# Patient Record
Sex: Female | Born: 1980 | Race: White | Hispanic: No | Marital: Married | Smoking: Former smoker
Health system: Southern US, Community
[De-identification: ages and names within clinical notes are randomized; demographics above are authoritative.]

## PROBLEM LIST (undated history)

## (undated) ENCOUNTER — Inpatient Hospital Stay (HOSPITAL_COMMUNITY): Payer: Self-pay

## (undated) DIAGNOSIS — Z3490 Encounter for supervision of normal pregnancy, unspecified, unspecified trimester: Secondary | ICD-10-CM

## (undated) DIAGNOSIS — E669 Obesity, unspecified: Secondary | ICD-10-CM

## (undated) DIAGNOSIS — O039 Complete or unspecified spontaneous abortion without complication: Secondary | ICD-10-CM

## (undated) DIAGNOSIS — IMO0001 Reserved for inherently not codable concepts without codable children: Secondary | ICD-10-CM

## (undated) DIAGNOSIS — O24419 Gestational diabetes mellitus in pregnancy, unspecified control: Secondary | ICD-10-CM

## (undated) DIAGNOSIS — E282 Polycystic ovarian syndrome: Secondary | ICD-10-CM

## (undated) HISTORY — DX: Obesity, unspecified: E66.9

## (undated) HISTORY — DX: Complete or unspecified spontaneous abortion without complication: O03.9

## (undated) HISTORY — DX: Encounter for supervision of normal pregnancy, unspecified, unspecified trimester: Z34.90

## (undated) HISTORY — DX: Gestational diabetes mellitus in pregnancy, unspecified control: O24.419

## (undated) HISTORY — DX: Polycystic ovarian syndrome: E28.2

## (undated) HISTORY — PX: EYE SURGERY: SHX253

---

## 2005-07-05 ENCOUNTER — Emergency Department (HOSPITAL_COMMUNITY): Admission: EM | Admit: 2005-07-05 | Discharge: 2005-07-05 | Payer: Self-pay | Admitting: Emergency Medicine

## 2005-09-28 ENCOUNTER — Emergency Department (HOSPITAL_COMMUNITY): Admission: EM | Admit: 2005-09-28 | Discharge: 2005-09-28 | Payer: Self-pay | Admitting: Emergency Medicine

## 2006-02-04 ENCOUNTER — Emergency Department (HOSPITAL_COMMUNITY): Admission: EM | Admit: 2006-02-04 | Discharge: 2006-02-04 | Payer: Self-pay | Admitting: Emergency Medicine

## 2006-03-13 ENCOUNTER — Emergency Department (HOSPITAL_COMMUNITY): Admission: EM | Admit: 2006-03-13 | Discharge: 2006-03-13 | Payer: Self-pay | Admitting: Emergency Medicine

## 2006-05-17 ENCOUNTER — Emergency Department (HOSPITAL_COMMUNITY): Admission: EM | Admit: 2006-05-17 | Discharge: 2006-05-17 | Payer: Self-pay | Admitting: Emergency Medicine

## 2006-08-31 ENCOUNTER — Emergency Department (HOSPITAL_COMMUNITY): Admission: EM | Admit: 2006-08-31 | Discharge: 2006-08-31 | Payer: Self-pay | Admitting: Emergency Medicine

## 2006-10-20 ENCOUNTER — Emergency Department (HOSPITAL_COMMUNITY): Admission: EM | Admit: 2006-10-20 | Discharge: 2006-10-20 | Payer: Self-pay | Admitting: Emergency Medicine

## 2007-07-02 ENCOUNTER — Emergency Department (HOSPITAL_COMMUNITY): Admission: EM | Admit: 2007-07-02 | Discharge: 2007-07-02 | Payer: Self-pay | Admitting: Emergency Medicine

## 2008-03-09 ENCOUNTER — Emergency Department (HOSPITAL_COMMUNITY): Admission: EM | Admit: 2008-03-09 | Discharge: 2008-03-10 | Payer: Self-pay | Admitting: Emergency Medicine

## 2008-04-14 ENCOUNTER — Emergency Department (HOSPITAL_COMMUNITY): Admission: EM | Admit: 2008-04-14 | Discharge: 2008-04-14 | Payer: Self-pay | Admitting: Emergency Medicine

## 2008-08-25 ENCOUNTER — Emergency Department (HOSPITAL_COMMUNITY): Admission: EM | Admit: 2008-08-25 | Discharge: 2008-08-26 | Payer: Self-pay | Admitting: Emergency Medicine

## 2008-10-20 ENCOUNTER — Emergency Department (HOSPITAL_COMMUNITY): Admission: EM | Admit: 2008-10-20 | Discharge: 2008-10-20 | Payer: Self-pay | Admitting: Emergency Medicine

## 2009-02-11 ENCOUNTER — Emergency Department (HOSPITAL_COMMUNITY): Admission: EM | Admit: 2009-02-11 | Discharge: 2009-02-11 | Payer: Self-pay | Admitting: Emergency Medicine

## 2009-03-23 ENCOUNTER — Emergency Department (HOSPITAL_COMMUNITY): Admission: EM | Admit: 2009-03-23 | Discharge: 2009-03-23 | Payer: Self-pay | Admitting: Emergency Medicine

## 2009-06-15 ENCOUNTER — Emergency Department (HOSPITAL_COMMUNITY): Admission: EM | Admit: 2009-06-15 | Discharge: 2009-06-15 | Payer: Self-pay | Admitting: Emergency Medicine

## 2009-06-24 IMAGING — CR DG SHOULDER 2+V*R*
3 series · 3 of 3 positions shown · non-contrast
Comparison: None

CLINICAL DATA: Right shoulder pain

RIGHT SHOULDER - 2+ VIEW

[view not recorded (1 of 3)]
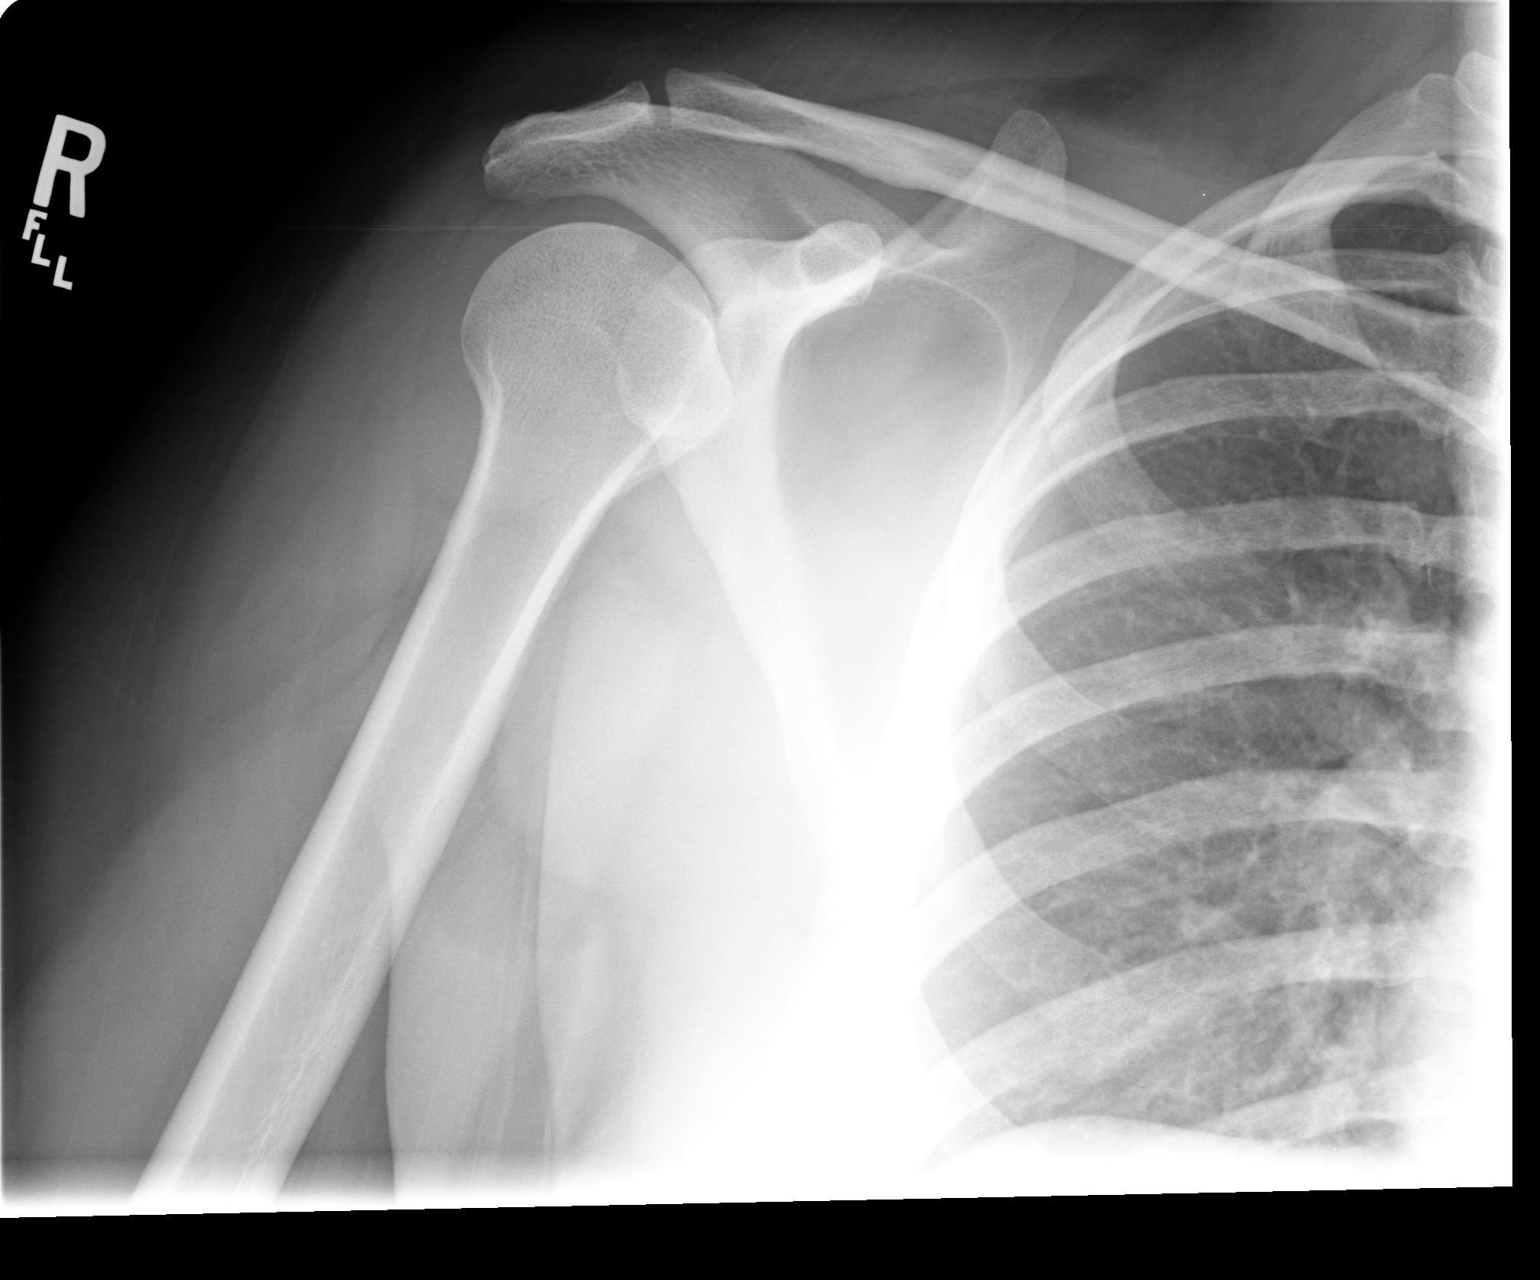

[view not recorded (2 of 3)]
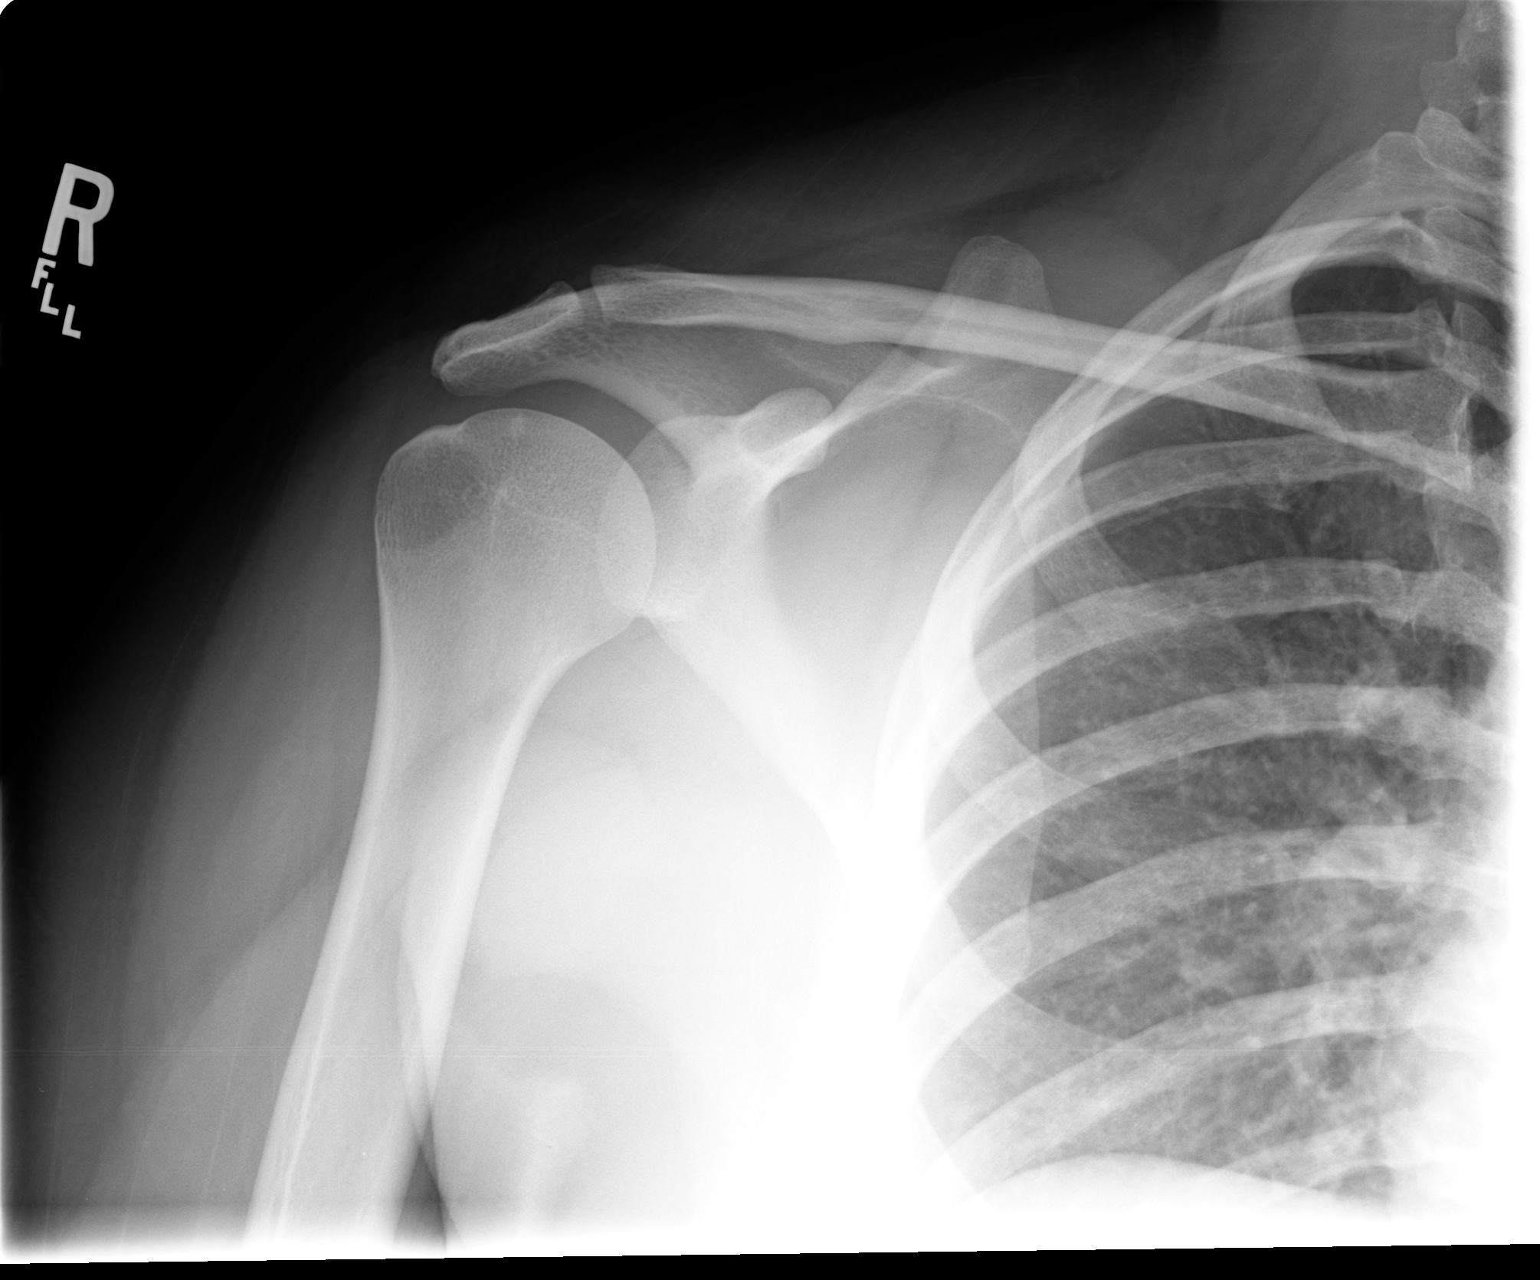

[view not recorded (3 of 3)]
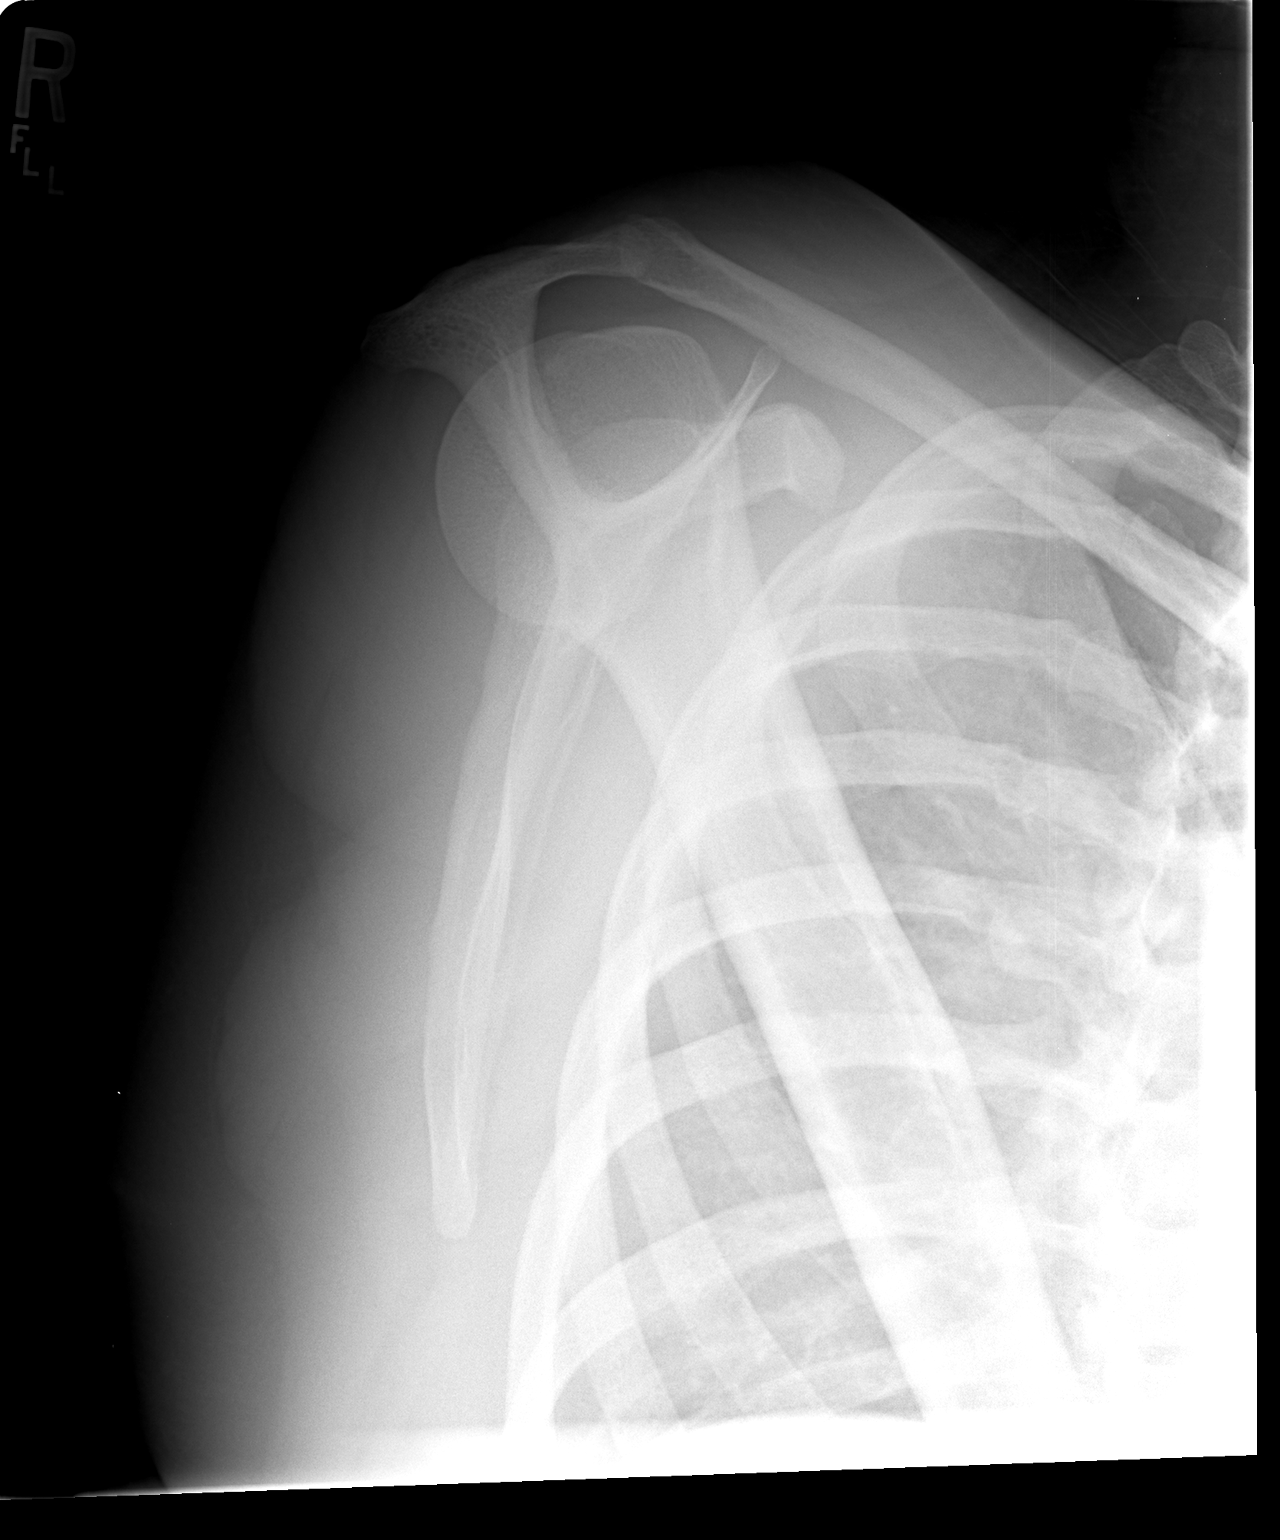

[3 of 3 positions shown; findings below may reference images not displayed]

FINDINGS: AC joint alignment normal.
Bone mineralization normal.
No acute fracture, dislocation, or bone destruction.
Visualized right ribs unremarkable.
IMPRESSION: No acute abnormalities.

## 2009-11-04 ENCOUNTER — Emergency Department (HOSPITAL_COMMUNITY): Admission: EM | Admit: 2009-11-04 | Discharge: 2009-11-04 | Payer: Self-pay | Admitting: Emergency Medicine

## 2011-03-17 LAB — PREGNANCY, URINE: Preg Test, Ur: NEGATIVE

## 2011-09-06 LAB — PREGNANCY, URINE: Preg Test, Ur: NEGATIVE

## 2013-05-03 ENCOUNTER — Ambulatory Visit (INDEPENDENT_AMBULATORY_CARE_PROVIDER_SITE_OTHER): Payer: BC Managed Care – PPO | Admitting: Obstetrics & Gynecology

## 2013-05-03 ENCOUNTER — Encounter: Payer: Self-pay | Admitting: Obstetrics & Gynecology

## 2013-05-03 VITALS — BP 140/90 | Ht 68.0 in | Wt 366.0 lb

## 2013-05-03 DIAGNOSIS — N912 Amenorrhea, unspecified: Secondary | ICD-10-CM

## 2013-05-03 LAB — TSH: TSH: 3.094 u[IU]/mL (ref 0.350–4.500)

## 2013-05-03 MED ORDER — MEDROXYPROGESTERONE ACETATE 10 MG PO TABS: 10.0000 mg | ORAL_TABLET | Freq: Every day | ORAL | Status: DC

## 2013-05-03 MED ORDER — NORGESTIM-ETH ESTRAD TRIPHASIC 0.18/0.215/0.25 MG-25 MCG PO TABS: 1.0000 | ORAL_TABLET | Freq: Every day | ORAL | Status: DC

## 2013-05-03 NOTE — Progress Notes (Signed)
Patient ID: Megan Hardin, female   DOB: [birth year], 32 y.o.   MRN: [id number] Lifelong anovulatory At most 4 periods a year Trying for 8 years No fathered No dyspareunia Sex 3-4 times a week Not diabetic  Check hgb a1c, tsh Provera sprintec Follow up 3 months Start ovulation induction

## 2013-05-06 ENCOUNTER — Telehealth: Payer: Self-pay | Admitting: Obstetrics & Gynecology

## 2013-05-06 MED ORDER — NORGESTREL-ETHINYL ESTRADIOL 0.3-30 MG-MCG PO TABS: 1.0000 | ORAL_TABLET | Freq: Every day | ORAL | Status: DC

## 2013-05-06 NOTE — Telephone Encounter (Signed)
See routing

## 2013-05-06 NOTE — Telephone Encounter (Signed)
Pt aware that different birth control pill had been e prescribed. JSY

## 2013-05-06 NOTE — Addendum Note (Signed)
Addended by: Lazaro Arms on: 05/06/2013 11:54 AM   Modules accepted: Orders

## 2013-05-06 NOTE — Telephone Encounter (Signed)
Spoke with pt. The birth control is $130.00. What else do you advise?

## 2013-05-06 NOTE — Telephone Encounter (Signed)
Different OCP e prescribed

## 2013-08-06 ENCOUNTER — Encounter (HOSPITAL_COMMUNITY): Payer: Self-pay | Admitting: *Deleted

## 2013-08-06 ENCOUNTER — Emergency Department (HOSPITAL_COMMUNITY)
Admission: EM | Admit: 2013-08-06 | Discharge: 2013-08-06 | Disposition: A | Discharge status: Home / Self Care | Payer: Self-pay | Attending: Emergency Medicine | Admitting: Emergency Medicine

## 2013-08-06 DIAGNOSIS — Z8742 Personal history of other diseases of the female genital tract: Secondary | ICD-10-CM | POA: Insufficient documentation

## 2013-08-06 DIAGNOSIS — L089 Local infection of the skin and subcutaneous tissue, unspecified: Secondary | ICD-10-CM | POA: Insufficient documentation

## 2013-08-06 DIAGNOSIS — Z87891 Personal history of nicotine dependence: Secondary | ICD-10-CM | POA: Insufficient documentation

## 2013-08-06 DIAGNOSIS — L0390 Cellulitis, unspecified: Secondary | ICD-10-CM

## 2013-08-06 DIAGNOSIS — L02419 Cutaneous abscess of limb, unspecified: Secondary | ICD-10-CM | POA: Insufficient documentation

## 2013-08-06 DIAGNOSIS — Y929 Unspecified place or not applicable: Secondary | ICD-10-CM | POA: Insufficient documentation

## 2013-08-06 DIAGNOSIS — Y939 Activity, unspecified: Secondary | ICD-10-CM | POA: Insufficient documentation

## 2013-08-06 MED ORDER — SULFAMETHOXAZOLE-TMP DS 800-160 MG PO TABS
1.0000 | ORAL_TABLET | Freq: Once | ORAL | Status: AC
  Administered 2013-08-06: 1 via ORAL
  Filled 2013-08-06: qty 1

## 2013-08-06 MED ORDER — HYDROCODONE-ACETAMINOPHEN 5-325 MG PO TABS: 1.0000 | ORAL_TABLET | ORAL | Status: DC | PRN

## 2013-08-06 MED ORDER — CEPHALEXIN 500 MG PO CAPS: 500.0000 mg | ORAL_CAPSULE | Freq: Four times a day (QID) | ORAL | Status: DC

## 2013-08-06 MED ORDER — HYDROCODONE-ACETAMINOPHEN 5-325 MG PO TABS
1.0000 | ORAL_TABLET | Freq: Once | ORAL | Status: AC
  Administered 2013-08-06: 1 via ORAL
  Filled 2013-08-06: qty 1

## 2013-08-06 MED ORDER — CEPHALEXIN 500 MG PO CAPS
500.0000 mg | ORAL_CAPSULE | Freq: Once | ORAL | Status: AC
  Administered 2013-08-06: 500 mg via ORAL
  Filled 2013-08-06: qty 1

## 2013-08-06 MED ORDER — SULFAMETHOXAZOLE-TRIMETHOPRIM 800-160 MG PO TABS: 1.0000 | ORAL_TABLET | Freq: Two times a day (BID) | ORAL | Status: DC

## 2013-08-06 NOTE — ED Notes (Signed)
Pt noticed a pimple on her lower leg yesterday. Pt states today it has gotten red, area has spread, and it is very painful.

## 2013-08-07 ENCOUNTER — Encounter (HOSPITAL_COMMUNITY): Payer: Self-pay

## 2013-08-07 ENCOUNTER — Emergency Department (HOSPITAL_COMMUNITY)
Admission: EM | Admit: 2013-08-07 | Discharge: 2013-08-07 | Disposition: A | Discharge status: Home / Self Care | Payer: Self-pay | Attending: Emergency Medicine | Admitting: Emergency Medicine

## 2013-08-07 DIAGNOSIS — Z87891 Personal history of nicotine dependence: Secondary | ICD-10-CM | POA: Insufficient documentation

## 2013-08-07 DIAGNOSIS — F411 Generalized anxiety disorder: Secondary | ICD-10-CM | POA: Insufficient documentation

## 2013-08-07 DIAGNOSIS — R062 Wheezing: Secondary | ICD-10-CM | POA: Insufficient documentation

## 2013-08-07 DIAGNOSIS — L02419 Cutaneous abscess of limb, unspecified: Secondary | ICD-10-CM | POA: Insufficient documentation

## 2013-08-07 DIAGNOSIS — R000 Tachycardia, unspecified: Secondary | ICD-10-CM | POA: Insufficient documentation

## 2013-08-07 DIAGNOSIS — Z792 Long term (current) use of antibiotics: Secondary | ICD-10-CM | POA: Insufficient documentation

## 2013-08-07 DIAGNOSIS — Z862 Personal history of diseases of the blood and blood-forming organs and certain disorders involving the immune mechanism: Secondary | ICD-10-CM | POA: Insufficient documentation

## 2013-08-07 DIAGNOSIS — Z79899 Other long term (current) drug therapy: Secondary | ICD-10-CM | POA: Insufficient documentation

## 2013-08-07 DIAGNOSIS — Z8639 Personal history of other endocrine, nutritional and metabolic disease: Secondary | ICD-10-CM | POA: Insufficient documentation

## 2013-08-07 LAB — GLUCOSE, CAPILLARY: Glucose-Capillary: 113 mg/dL — ABNORMAL HIGH (ref 70–99)

## 2013-08-07 LAB — CBC WITH DIFFERENTIAL/PLATELET
Eosinophils Relative: 2 % (ref 0–5)
HCT: 38.8 % (ref 36.0–46.0)
Hemoglobin: 13.2 g/dL (ref 12.0–15.0)
Lymphocytes Relative: 12 % (ref 12–46)
MCV: 90.9 fL (ref 78.0–100.0)
Monocytes Absolute: 0.8 10*3/uL (ref 0.1–1.0)
Monocytes Relative: 6 % (ref 3–12)
Neutro Abs: 11 10*3/uL — ABNORMAL HIGH (ref 1.7–7.7)
RDW: 13.3 % (ref 11.5–15.5)
WBC: 13.7 10*3/uL — ABNORMAL HIGH (ref 4.0–10.5)

## 2013-08-07 LAB — BASIC METABOLIC PANEL
BUN: 8 mg/dL (ref 6–23)
CO2: 23 mEq/L (ref 19–32)
Calcium: 9.5 mg/dL (ref 8.4–10.5)
Chloride: 100 mEq/L (ref 96–112)
Creatinine, Ser: 0.78 mg/dL (ref 0.50–1.10)
Glucose, Bld: 113 mg/dL — ABNORMAL HIGH (ref 70–99)

## 2013-08-07 MED ORDER — MORPHINE SULFATE 4 MG/ML IJ SOLN
4.0000 mg | Freq: Once | INTRAMUSCULAR | Status: AC
  Administered 2013-08-07: 4 mg via INTRAVENOUS
  Filled 2013-08-07: qty 1

## 2013-08-07 MED ORDER — VANCOMYCIN HCL IN DEXTROSE 1-5 GM/200ML-% IV SOLN
1000.0000 mg | Freq: Once | INTRAVENOUS | Status: AC
  Administered 2013-08-07: 1000 mg via INTRAVENOUS
  Filled 2013-08-07: qty 200

## 2013-08-07 MED ORDER — ONDANSETRON HCL 4 MG PO TABS
4.0000 mg | ORAL_TABLET | Freq: Once | ORAL | Status: AC
  Administered 2013-08-07: 4 mg via ORAL
  Filled 2013-08-07: qty 1

## 2013-08-07 MED ORDER — KETOROLAC TROMETHAMINE 10 MG PO TABS
10.0000 mg | ORAL_TABLET | Freq: Once | ORAL | Status: AC
  Administered 2013-08-07: 10 mg via ORAL
  Filled 2013-08-07: qty 1

## 2013-08-07 NOTE — ED Notes (Signed)
Pt here for recheck of her left lower leg, has a "pimple", was seen in the ed yesterday for the same, has not gotten scripts filled

## 2013-08-07 NOTE — ED Provider Notes (Signed)
CSN: [id number]     Arrival date & time 08/07/13  0857 History   First MD Initiated Contact with Patient 08/07/13 [phone]     No chief complaint on file.  (Consider location/radiation/quality/duration/timing/severity/associated sxs/prior Treatment) HPI Comments: Pt states she noticed a pimple on the left leg last week. The redness around the pimple area got progressively worse. She came to the ED yesterday and was noted to have some early cellulitis. Rx for keflex, septra, and norco were given to the patient. Pt did not get these filled. Today she noted more pain with walking and attempting to work. She felt "different" in her body and noted that it was difficult to concentrate. She was concerned about infection getting worse and spreading in her body. No fever, chill, LOC, and no n/v. She present now for evaluation of worsening symptoms.  The history is provided by the patient.    Past Medical History  Diagnosis Date  . PCOS (polycystic ovarian syndrome)    Past Surgical History  Procedure Laterality Date  . Eye surgery Left    Family History  Problem Relation Age of Onset  . Diabetes Mother   . Hypertension Father   . Diabetes Maternal Grandmother   . Diabetes Maternal Grandfather    History  Substance Use Topics  . Smoking status: Former Smoker    Types: Cigarettes  . Smokeless tobacco: Not on file  . Alcohol Use: Yes     Comment: socially   OB History   Grav Para Term Preterm Abortions TAB SAB Ect Mult Living                 Review of Systems  Constitutional: Negative for activity change.       All ROS Neg except as noted in HPI  HENT: Negative for nosebleeds and neck pain.   Eyes: Negative for photophobia and discharge.  Respiratory: Positive for wheezing. Negative for cough and shortness of breath.   Cardiovascular: Negative for chest pain and palpitations.  Gastrointestinal: Negative for abdominal pain and blood in stool.  Genitourinary: Negative for dysuria,  frequency and hematuria.  Musculoskeletal: Negative for back pain and arthralgias.       Leg pain  Skin: Negative.   Neurological: Negative for dizziness, seizures and speech difficulty.  Psychiatric/Behavioral: Negative for hallucinations and confusion. The patient is nervous/anxious.     Allergies  Dilaudid  Home Medications   Current Outpatient Rx  Name  Route  Sig  Dispense  Refill  . cephALEXin (KEFLEX) 500 MG capsule   Oral   Take 1 capsule (500 mg total) by mouth 4 (four) times daily.   40 capsule   0   . Doxylamine Succinate, Sleep, (SLEEP AID PO)   Oral   Take 1 tablet by mouth at bedtime.          Marland Kitchen HYDROcodone-acetaminophen (NORCO/VICODIN) 5-325 MG per tablet   Oral   Take 1 tablet by mouth every 4 (four) hours as needed for pain.   20 tablet   0   . norgestrel-ethinyl estradiol (LO/OVRAL,CRYSELLE) 0.3-30 MG-MCG tablet   Oral   Take 1 tablet by mouth daily.   1 Package   11   . sulfamethoxazole-trimethoprim (SEPTRA DS) 800-160 MG per tablet   Oral   Take 1 tablet by mouth every 12 (twelve) hours.   20 tablet   0    LMP 07/06/2013 Physical Exam  Nursing note and vitals reviewed. Constitutional: She is oriented to person, place, and time.  She appears well-developed and well-nourished.  Non-toxic appearance.  HENT:  Head: Normocephalic.  Right Ear: Tympanic membrane and external ear normal.  Left Ear: Tympanic membrane and external ear normal.  Eyes: EOM and lids are normal. Pupils are equal, round, and reactive to light.  Neck: Normal range of motion. Neck supple. Carotid bruit is not present.  Cardiovascular: Regular rhythm, normal heart sounds, intact distal pulses and normal pulses.  Tachycardia present.   Pulmonary/Chest: No respiratory distress. She has wheezes.  Abdominal: Soft. Bowel sounds are normal. There is no tenderness. There is no guarding.  Musculoskeletal: Normal range of motion.  INcrease redness and swelling of the left lower leg.  Area near pimple of the lower leg is tender to palpation. No palpable nodes of the inguinal area.  Lymphadenopathy:       Head (right side): No submandibular adenopathy present.       Head (left side): No submandibular adenopathy present.    She has no cervical adenopathy.  Neurological: She is alert and oriented to person, place, and time. She has normal strength. No cranial nerve deficit or sensory deficit.  Skin: Skin is warm and dry.  Psychiatric: She has a normal mood and affect. Her speech is normal.    ED Course  Procedures (including critical care time) Labs Review Labs Reviewed - No data to display Imaging Review No results found.  MDM  No diagnosis found. *I have reviewed nursing notes, vital signs, and all appropriate lab and imaging results for this patient.**  Pt has increasing cellulitis of the left lower leg. CBC reveals elevated WBC of 13.7. Bmet wnl. Pt was given IV vancomycin and morphine. Pt encouraged to be admitted for additional management, but she request to be d/c and complete the antibiotics she was previously ordered. Pt to continue the septra and keflex as ordered. She is invited to return if any changes or problem.  Kathie Dike, PA-C 08/10/13 [phone]

## 2013-08-08 ENCOUNTER — Observation Stay (HOSPITAL_COMMUNITY): Payer: Self-pay

## 2013-08-08 ENCOUNTER — Encounter (HOSPITAL_COMMUNITY): Payer: Self-pay | Admitting: *Deleted

## 2013-08-08 ENCOUNTER — Inpatient Hospital Stay (HOSPITAL_COMMUNITY)
Admission: EM | Admit: 2013-08-08 | Discharge: 2013-08-10 | DRG: 603 | Disposition: A | Discharge status: Home / Self Care | Payer: Self-pay | Attending: Internal Medicine | Admitting: Internal Medicine

## 2013-08-08 DIAGNOSIS — E282 Polycystic ovarian syndrome: Secondary | ICD-10-CM | POA: Diagnosis present

## 2013-08-08 DIAGNOSIS — D649 Anemia, unspecified: Secondary | ICD-10-CM | POA: Diagnosis present

## 2013-08-08 DIAGNOSIS — Z87891 Personal history of nicotine dependence: Secondary | ICD-10-CM

## 2013-08-08 DIAGNOSIS — L02419 Cutaneous abscess of limb, unspecified: Principal | ICD-10-CM | POA: Diagnosis present

## 2013-08-08 DIAGNOSIS — D72829 Elevated white blood cell count, unspecified: Secondary | ICD-10-CM | POA: Diagnosis present

## 2013-08-08 DIAGNOSIS — M25569 Pain in unspecified knee: Secondary | ICD-10-CM | POA: Diagnosis present

## 2013-08-08 DIAGNOSIS — L03115 Cellulitis of right lower limb: Secondary | ICD-10-CM | POA: Diagnosis present

## 2013-08-08 LAB — CBC
HCT: 37.9 % (ref 36.0–46.0)
Hemoglobin: 12.9 g/dL (ref 12.0–15.0)
MCH: 31.2 pg (ref 26.0–34.0)
MCHC: 34 g/dL (ref 30.0–36.0)
MCV: 91.8 fL (ref 78.0–100.0)
Platelets: 393 10*3/uL (ref 150–400)
Platelets: 342 10*3/uL (ref 150–400)
RBC: 4.13 MIL/uL (ref 3.87–5.11)
RBC: 3.69 MIL/uL — ABNORMAL LOW (ref 3.87–5.11)
RDW: 13.4 % (ref 11.5–15.5)
WBC: 10.9 10*3/uL — ABNORMAL HIGH (ref 4.0–10.5)
WBC: 10.8 10*3/uL — ABNORMAL HIGH (ref 4.0–10.5)

## 2013-08-08 LAB — BASIC METABOLIC PANEL
BUN: 13 mg/dL (ref 6–23)
CO2: 28 mEq/L (ref 19–32)
Calcium: 9.6 mg/dL (ref 8.4–10.5)
Chloride: 103 mEq/L (ref 96–112)
Creatinine, Ser: 0.98 mg/dL (ref 0.50–1.10)
GFR calc Af Amer: 88 mL/min — ABNORMAL LOW (ref >90)
GFR calc non Af Amer: 76 mL/min — ABNORMAL LOW (ref >90)
Glucose, Bld: 124 mg/dL — ABNORMAL HIGH (ref 70–99)
Potassium: 4.2 mEq/L (ref 3.5–5.1)
Sodium: 138 mEq/L (ref 135–145)

## 2013-08-08 MED ORDER — NORGESTREL-ETHINYL ESTRADIOL 0.3-30 MG-MCG PO TABS
1.0000 | ORAL_TABLET | Freq: Every day | ORAL | Status: DC
  Administered 2013-08-08 – 2013-08-09 (×2): 1 via ORAL

## 2013-08-08 MED ORDER — VANCOMYCIN HCL IN DEXTROSE 1-5 GM/200ML-% IV SOLN
1000.0000 mg | Freq: Once | INTRAVENOUS | Status: AC
  Administered 2013-08-08: 1000 mg via INTRAVENOUS
  Filled 2013-08-08: qty 200

## 2013-08-08 MED ORDER — ACETAMINOPHEN 325 MG PO TABS
650.0000 mg | ORAL_TABLET | Freq: Four times a day (QID) | ORAL | Status: DC | PRN
  Administered 2013-08-09: 650 mg via ORAL
  Filled 2013-08-08: qty 2

## 2013-08-08 MED ORDER — ONDANSETRON HCL 4 MG/2ML IJ SOLN
4.0000 mg | Freq: Four times a day (QID) | INTRAMUSCULAR | Status: DC | PRN
  Administered 2013-08-09: 4 mg via INTRAVENOUS
  Filled 2013-08-08: qty 2

## 2013-08-08 MED ORDER — ACETAMINOPHEN 650 MG RE SUPP: 650.0000 mg | Freq: Four times a day (QID) | RECTAL | Status: DC | PRN

## 2013-08-08 MED ORDER — SODIUM CHLORIDE 0.9 % IV SOLN
Freq: Once | INTRAVENOUS | Status: AC
  Administered 2013-08-08: 14:00:00 via INTRAVENOUS

## 2013-08-08 MED ORDER — HYDROCODONE-ACETAMINOPHEN 5-325 MG PO TABS
1.0000 | ORAL_TABLET | ORAL | Status: DC | PRN
  Administered 2013-08-08 – 2013-08-09 (×3): 2 via ORAL
  Filled 2013-08-08 (×3): qty 2

## 2013-08-08 MED ORDER — ENOXAPARIN SODIUM 80 MG/0.8ML ~~LOC~~ SOLN
80.0000 mg | SUBCUTANEOUS | Status: DC
  Administered 2013-08-08 – 2013-08-09 (×2): 80 mg via SUBCUTANEOUS
  Filled 2013-08-08 (×3): qty 0.8

## 2013-08-08 MED ORDER — ONDANSETRON HCL 4 MG PO TABS: 4.0000 mg | ORAL_TABLET | Freq: Four times a day (QID) | ORAL | Status: DC | PRN

## 2013-08-08 MED ORDER — IBUPROFEN 200 MG PO TABS
600.0000 mg | ORAL_TABLET | Freq: Once | ORAL | Status: AC
  Administered 2013-08-08: 600 mg via ORAL
  Filled 2013-08-08: qty 3

## 2013-08-08 MED ORDER — VANCOMYCIN HCL 10 G IV SOLR
1500.0000 mg | Freq: Two times a day (BID) | INTRAVENOUS | Status: DC
  Administered 2013-08-08 – 2013-08-09 (×2): 1500 mg via INTRAVENOUS
  Filled 2013-08-08 (×3): qty 1500

## 2013-08-08 NOTE — ED Notes (Signed)
Pt reports was seen Tues night and Wed morning at [facility] ED for what started out as a "pimple" to left leg. Dx with cellulitis on Tuesday told to return if got worse, leg became red with increased swelling, went back to [place] on Wednesday. Give 1 IV abx and 2 PO abx, cephalexin and bactrim. Pt reports she woke up today and the reddness is now on her right leg as well. Now has purple area where the "pimple" started. Pt also reports knees hurt 3/10.

## 2013-08-08 NOTE — ED Notes (Signed)
Report given Stan Head on floor

## 2013-08-08 NOTE — H&P (Addendum)
[facility] History and Physical  Megan Hardin ZOX:[id number] DOB: [birth year] DOA: 08/08/2013  Referring physician: Dr. Estell Harpin PCP: Provider Not In System  Specialists: none  Chief Complaint: Worsening cellulitis  HPI: Megan Hardin is a 32 y.o. female with no significant past medical history who presents with the complaints. She states that labor day(3 days ago) she was out golfing and later on noted that she had a small red nodular area'just like a pimple'around her left mid shin area-she thought possibly an insect bite. The next day the redness has spread down her left leg and it was swollen and painful sore she went to [facility] ED and she was given a Keflex and Bactrim as well as Vicodin for pain. She went home and at the next day she felt nauseous and disoriented-so she went back to the ED and she was given IV vancomycin, and reassured that she had a cellulitis and discharged home again. When she woke up this morning she noted that she had developed redness on her right leg with pain and swelling as well and so she came to the ED here. She admits to having chills, denies fever. She also reports that she began having bilateral knee pain today-she denies knee swelling and no redness in her knees. She also denies any trauma. She reports that last night she did attempt to squeeze the small nodular area on her left leg and expressed a small amount of drainage but no more today.She was seen in the ED and admitted for IV antibiotics and further evaluation.   Review of Systems: The patient denies anorexia, fever, weight loss,, vision loss, decreased hearing, hoarseness, chest pain, syncope, dyspnea on exertion, peripheral edema, balance deficits, hemoptysis, abdominal pain, melena, hematochezia, severe indigestion/heartburn, hematuria, incontinence, genital sores, muscle weakness, suspicious skin lesions, transient blindness, difficulty walking, depression, unusual weight    Past Medical  History  Diagnosis Date  . PCOS (polycystic ovarian syndrome)    Past Surgical History  Procedure Laterality Date  . Eye surgery Left    Social History:  reports that she has quit smoking. Her smoking use included Cigarettes. She smoked 0.00 packs per day. She has never used smokeless tobacco. She reports that  drinks alcohol. She reports that she does not use illicit drugs.  where does patient live--home   Allergies  Allergen Reactions  . Dilaudid [Hydromorphone Hcl] Other (See Comments)    Panic attacks    Family History  Problem Relation Age of Onset  . Diabetes Mother   . Hypertension Father   . Diabetes Maternal Grandmother   . Diabetes Maternal Grandfather     Prior to Admission medications   Medication Sig Start Date End Date Taking? Authorizing Provider  cephALEXin (KEFLEX) 500 MG capsule Take 1 capsule (500 mg total) by mouth 4 (four) times daily. 08/06/13  Yes Burgess Amor, PA-C  Doxylamine Succinate, Sleep, (SLEEP AID PO) Take 1 tablet by mouth at bedtime.    Yes Historical Provider, MD  HYDROcodone-acetaminophen (NORCO/VICODIN) 5-325 MG per tablet Take 1 tablet by mouth every 4 (four) hours as needed for pain. 08/06/13  Yes Burgess Amor, PA-C  norgestrel-ethinyl estradiol (LO/OVRAL,CRYSELLE) 0.3-30 MG-MCG tablet Take 1 tablet by mouth daily. 05/06/13  Yes Lazaro Arms, MD  sulfamethoxazole-trimethoprim (SEPTRA DS) 800-160 MG per tablet Take 1 tablet by mouth every 12 (twelve) hours. 08/06/13  Yes Burgess Amor, PA-C   Physical Exam: Filed Vitals:   08/08/13 1254  BP: 143/70  Pulse: 89  Temp:  97.9 F (36.6 C)  Resp: 18   Constitutional: Vital signs reviewed.  Patient is a well-developed, obese in no acute distress and cooperative with exam. Alert and oriented x3.  Head: Normocephalic and atraumatic Mouth: no erythema or exudates, MMM Eyes: PERRL, EOMI, conjunctivae normal, No scleral icterus.  Neck: Supple, Trachea midline normal ROM, No JVD, mass, thyromegaly, or carotid  bruit present.  Cardiovascular: RRR, S1 normal, S2 normal, no MRG, pulses symmetric and intact bilaterally Pulmonary/Chest: normal respiratory effort, CTAB, no wheezes, rales, or rhonchi obese Abdominal: Soft. Non-tender, non-distended, bowel sounds are normal, no masses, organomegaly, or guarding present.  GU: no CVA tenderness Musculoskeletal: Bilateral knee joint tenderness, no synovitis appreciated, no swelling and no erythema. No ballotable effusion   Extremities: On her left lower extremity, +1 edema, tender and she has a small erythematous nodular lesion around her mid shin area-no drainage, and the rest of the lower leg is erythematous down to ankle area. The right lower extremity is also tender with 1+ edema with small area of erythema medially around that mid shin. She has tatoos on lower legs bilaterally  Neurological: A&O x3, Strength is normal and symmetric bilaterally, cranial nerve II-XII are grossly intact, no focal motor deficit, sensory intact to light touch bilaterally.  Skin: Warm, dry and intact. No rash, cyanosis, or clubbing.  Psychiatric: Normal mood and affect. speech and behavior is normal. Judgment and thought content normal. Cognition and memory are normal.     Labs on Admission:  Basic Metabolic Panel:  Recent Labs Lab 08/07/13 0933 08/08/13 1110  NA 135 138  K 4.4 4.2  CL 100 103  CO2 23 28  GLUCOSE 113* 124*  BUN 8 13  CREATININE 0.78 0.98  CALCIUM 9.5 9.6   Liver Function Tests: No results found for this basename: AST, ALT, ALKPHOS, BILITOT, PROT, ALBUMIN,  in the last 168 hours No results found for this basename: LIPASE, AMYLASE,  in the last 168 hours No results found for this basename: AMMONIA,  in the last 168 hours CBC:  Recent Labs Lab 08/07/13 0933 08/08/13 1110  WBC 13.7* 10.9*  NEUTROABS 11.0*  --   HGB 13.2 12.9  HCT 38.8 37.9  MCV 90.9 91.8  PLT 364 393   Cardiac Enzymes: No results found for this basename: CKTOTAL, CKMB,  CKMBINDEX, TROPONINI,  in the last 168 hours  BNP (last 3 results) No results found for this basename: PROBNP,  in the last 8760 hours CBG:  Recent Labs Lab 08/06/13 2147  GLUCAP 113*    Radiological Exams on Admission: No results found.    Assessment/Plan Active Problems:   Bilateral cellulitis of lower leg -As discussed above, empiric abx with vanc -follow obtain cultures if febrile -narcotics for pain control   Pain, joint, Bil knee -obtain x-ray, Rh factor, ANA, ESR and CRP -Pain management -follow and further recs pending above studies     Code Status: full Family Communication: none at bedside Disposition Plan: admit for obsv  Time spent: >30  Kela Millin [facility] Pager [phone]  If 7PM-7AM, please contact night-coverage [email or web address] Password [facility] 08/08/2013, 7:32 PM

## 2013-08-08 NOTE — ED Provider Notes (Signed)
CSN: [id number]     Arrival date & time 08/08/13  1191 History   First MD Initiated Contact with Patient 08/08/13 1028     Chief Complaint  Patient presents with  . Cellulitis   (Consider location/radiation/quality/duration/timing/severity/associated sxs/prior Treatment) HPI Pt is a 32yo female with hx of PCOS and recent dx of cellulitis c/o worsening redness and pain in left lower leg as well as new onset redness and pain in right lower leg.  Reports golfing on Monday, 9/1 when she noticed an insect bite on left lower leg, by Tuesday she was seen at [facility] and dx with cellulitis, discharged with antibiotics.  Symptoms worsened so returned to [facility] yesterday, 9/3, given IV vancomycin and dx Rx: keflex and bactrim.  States last night she noticed redness starting in right lower leg.  Pain is constant, burning, 5/10, both legs and her knees.  States pain is worse with walking and palpation. Has not taken anything for pain.  Reports nausea and 1 episode of vomiting.  Denies fever. Denies PMH cellulitis, abscesses, or DM.     Past Medical History  Diagnosis Date  . PCOS (polycystic ovarian syndrome)    Past Surgical History  Procedure Laterality Date  . Eye surgery Left    Family History  Problem Relation Age of Onset  . Diabetes Mother   . Hypertension Father   . Diabetes Maternal Grandmother   . Diabetes Maternal Grandfather    History  Substance Use Topics  . Smoking status: Former Smoker    Types: Cigarettes  . Smokeless tobacco: Not on file  . Alcohol Use: Yes     Comment: socially   OB History   Grav Para Term Preterm Abortions TAB SAB Ect Mult Living                 Review of Systems  Constitutional: Negative for fever and chills.  Gastrointestinal: Positive for nausea and vomiting ( x1, NBNB). Negative for abdominal pain.  Skin: Positive for color change and rash.  All other systems reviewed and are negative.    Allergies  Dilaudid  Home Medications    Current Outpatient Rx  Name  Route  Sig  Dispense  Refill  . cephALEXin (KEFLEX) 500 MG capsule   Oral   Take 1 capsule (500 mg total) by mouth 4 (four) times daily.   40 capsule   0   . Doxylamine Succinate, Sleep, (SLEEP AID PO)   Oral   Take 1 tablet by mouth at bedtime.          Marland Kitchen HYDROcodone-acetaminophen (NORCO/VICODIN) 5-325 MG per tablet   Oral   Take 1 tablet by mouth every 4 (four) hours as needed for pain.   20 tablet   0   . norgestrel-ethinyl estradiol (LO/OVRAL,CRYSELLE) 0.3-30 MG-MCG tablet   Oral   Take 1 tablet by mouth daily.   1 Package   11   . sulfamethoxazole-trimethoprim (SEPTRA DS) 800-160 MG per tablet   Oral   Take 1 tablet by mouth every 12 (twelve) hours.   20 tablet   0    BP 148/87  Pulse 89  Temp(Src) 98.2 F (36.8 C) (Oral)  Resp 16  SpO2 99%  LMP 07/06/2013 Physical Exam  Nursing note and vitals reviewed. Constitutional: She appears well-developed and well-nourished. No distress.  Mobidly obese female lying comfortably in exam bed. NAD.  HENT:  Head: Normocephalic and atraumatic.  Eyes: Conjunctivae are normal. No scleral icterus.  Neck: Normal  range of motion.  Cardiovascular: Normal rate, regular rhythm and normal heart sounds.   Pulmonary/Chest: Effort normal and breath sounds normal. No respiratory distress. She has no wheezes. She has no rales. She exhibits no tenderness.  Abdominal: Soft. Bowel sounds are normal. She exhibits no distension and no mass. There is no tenderness. There is no rebound and no guarding.  Musculoskeletal: Normal range of motion. She exhibits edema and tenderness.       Legs: Left lower leg: Diffuse erythema and warmth  with central lesion that is purple in color. TTP.  No induration or fluctuance.  Right lower leg: 2 circular areas or erythema and warmth.  TTP.  Mild to moderate swelling.  Pedal pulses 2+, FROM of ankles and toes.  Neurological: She is alert.  Skin: Skin is warm and dry. She is  not diaphoretic. There is erythema.    ED Course  Procedures (including critical care time) Labs Review Labs Reviewed  CBC - Abnormal; Notable for the following:    WBC 10.9 (*)    All other components within normal limits  BASIC METABOLIC PANEL - Abnormal; Notable for the following:    Glucose, Bld 124 (*)    GFR calc non Af Amer 76 (*)    GFR calc Af Amer 88 (*)    All other components within normal limits   Imaging Review No results found.  MDM   1. Bilateral cellulitis of lower leg    Discussed pt with Dr. Estell Harpin who agrees pt needs to be admitted for failed outpatient tx of cellulitis.  Pt is afebrile, vitals unremarkable.  Will get basic labs and start on 1g of Vancomycin.  12:17 PMPt declined pain or nausea medication at this time.  Pt does not have PCP, will be admitted via [facility].    12:17 PM Consulted Dr. Suanne Marker who agreed to admit pt to med-surg for further tx of cellulitis.     Junius Finner, PA-C 08/08/13 1217

## 2013-08-08 NOTE — ED Provider Notes (Signed)
Medical screening examination/treatment/procedure(s) were performed by non-physician practitioner and as supervising physician I was immediately available for consultation/collaboration.   Joya Gaskins, MD 08/08/13 2350

## 2013-08-08 NOTE — ED Provider Notes (Signed)
Medical screening examination/treatment/procedure(s) were conducted as a shared visit with non-physician practitioner(s) and myself.  I personally evaluated the patient during the encounter Pt with cellulitus to both legs  Benny Lennert, MD 08/08/13 [phone]

## 2013-08-08 NOTE — Progress Notes (Signed)
Pt confirms pcp is [facility] providers  EPIC updated  Pt also given written copy of the following   [facility] [employer] [address] [facility] Dept.  [address] [place] [address] [place] [address]  Blondell Reveal  Phone: [phone] Phone: [phone]  Phone: [phone]  [facility] -health screening 36 & older, annual physical exams, acute care, some conditions may require a Dr's consult/referral, immunizations, preventive care Appointments Monday through Friday After hours on each third Thursday of each month  [address] [place] [address] [phone] [facility] Mental Health Phone: [phone]  [facility] Child Abuse Hotline  [phone]  [phone] (After Hours)

## 2013-08-08 NOTE — Progress Notes (Signed)
PHARMACY - LOVENOX  Pharmacy asked to adjust Lovenox dose as needed for VTE prophylaxis  32 yr female with CrCl > 120 ml/min, TBW = 164.8 kg with BMI = 54  As BMI > 30 will adjust Lovenox to 0.5 mg/kg/q24h  PLAN:  Change Lovenox to 80 mg sq q24h for VTE prophylaxis  Terrilee Files, PharmD 08/08/13

## 2013-08-08 NOTE — Progress Notes (Signed)
ANTIBIOTIC CONSULT NOTE - INITIAL  Pharmacy Consult for Vancomycin Indication: cellulitis  Allergies  Allergen Reactions  . Dilaudid [Hydromorphone Hcl] Other (See Comments)    Panic attacks   Patient Measurements: Height: 5' 8.5" (174 cm) Weight: 363 lb 4.8 oz (164.792 kg) IBW/kg (Calculated) : 65.05  Vital Signs: Temp: 97.9 F (36.6 C) (09/04 1254) Temp src: Oral (09/04 1020) BP: 143/70 mmHg (09/04 1254) Pulse Rate: 89 (09/04 1254) Intake/Output from previous day:   Intake/Output from this shift:    Labs:  Recent Labs  08/07/13 0933 08/08/13 1110  WBC 13.7* 10.9*  HGB 13.2 12.9  PLT 364 393  CREATININE 0.78 0.98   Estimated Creatinine Clearance: 136.6 ml/min (by C-G formula based on Cr of 0.98). No results found for this basename: VANCOTROUGH, VANCOPEAK, VANCORANDOM, GENTTROUGH, GENTPEAK, GENTRANDOM, TOBRATROUGH, TOBRAPEAK, TOBRARND, AMIKACINPEAK, AMIKACINTROU, AMIKACIN,  in the last 72 hours   Microbiology: No results found for this or any previous visit (from the past 720 hour(s)).  Medical History: Past Medical History  Diagnosis Date  . PCOS (polycystic ovarian syndrome)    Medications:  Anti-infectives   Start     Dose/Rate Route Frequency Ordered Stop   08/08/13 2000  vancomycin (VANCOCIN) 1,500 mg in sodium chloride 0.9 % 500 mL IVPB     1,500 mg 250 mL/hr over 120 Minutes Intravenous Every 12 hours 08/08/13 1944     08/08/13 1100  vancomycin (VANCOCIN) IVPB 1000 mg/200 mL premix     1,000 mg 200 mL/hr over 60 Minutes Intravenous  Once 08/08/13 1100 08/08/13 1435     Assessment: 32 yoF with LLE cellulitis. Patient took Keflex PTA, plan admit for IV abx  Vancomycin 1gm given in ED ~ 1330  Patient BMI > 40, renal function wnl  Goal of Therapy:  Vancomycin trough level 10-15 mcg/ml  Plan:   Vancomycin to continue at 1500mg  q12  Otho Bellows PharmD Pager [phone] 08/08/2013, 7:58 PM

## 2013-08-08 NOTE — ED Provider Notes (Signed)
CSN: [id number]     Arrival date & time 08/06/13  1922 History   First MD Initiated Contact with Patient 08/06/13 2042     Chief Complaint  Patient presents with  . Insect Bite   (Consider location/radiation/quality/duration/timing/severity/associated sxs/prior Treatment) HPI Comments: Megan Hardin is a 32 y.o. Female presenting with either a small pimple or possibly an insect bite (she was golfing when she first noticed) on her left lower anterior leg which she first noticed yesterday.  During the course of today the area has become more painful and now has a reddened tender area distal to the site which she blames on standing at work for the past 13 hours .  She has taken 3 tablets of borrowed keflex today for this problem.  She did attempt to squeeze the pimple last night and a small amount of white fluid was expressed, no further drainage today.  She denies fevers, chills, nausea or other complaint.     The history is provided by the patient.    Past Medical History  Diagnosis Date  . PCOS (polycystic ovarian syndrome)    Past Surgical History  Procedure Laterality Date  . Eye surgery Left    Family History  Problem Relation Age of Onset  . Diabetes Mother   . Hypertension Father   . Diabetes Maternal Grandmother   . Diabetes Maternal Grandfather    History  Substance Use Topics  . Smoking status: Former Smoker    Types: Cigarettes  . Smokeless tobacco: Never Used  . Alcohol Use: Yes     Comment: socially   OB History   Grav Para Term Preterm Abortions TAB SAB Ect Mult Living                 Review of Systems  Constitutional: Negative for fever and chills.  HENT: Negative for facial swelling.   Respiratory: Negative for shortness of breath and wheezing.   Skin: Positive for color change and wound.  Neurological: Negative for numbness.    Allergies  Dilaudid  Home Medications  No current outpatient prescriptions on file. BP 141/68  Pulse 103  Temp(Src)  98.1 F (36.7 C)  Resp 20  Ht 5' 8.5" (1.74 m)  Wt 350 lb (158.759 kg)  BMI 52.44 kg/m2  SpO2 100%  LMP 07/06/2013 Physical Exam  Constitutional: She appears well-developed and well-nourished. No distress.  HENT:  Head: Normocephalic.  Neck: Neck supple.  Cardiovascular: Normal rate.   Pulmonary/Chest: Effort normal. She has no wheezes.  Musculoskeletal: Normal range of motion. She exhibits no edema.  Skin: Skin is warm. There is erythema.  Small slightly raised papule left mid anterior tibia, no fluctuance or drainage, central punctum.  8 cm irregular shaped patch of erythema distal to the papule, ending at the ankle. No posterior leg involvement.  Pedal pulses full.  No red streaking,  No inguinal adenopathy.    ED Course  Procedures (including critical care time) Labs Review Labs Reviewed - No data to display Imaging Review No results found.  MDM   1. Cellulitis    Informal bedside US performed.  No fluid/abscess pocket at site of papule. Cellulitis edges marked with skin marker.  She was placed on bactrim, will continue keflex, added hydrcodone for pain relief. Encouraged elevation, warm compresses.  Recheck in 2 days, sooner if worsened.    Burgess Amor, PA-C 08/08/13 1452

## 2013-08-09 DIAGNOSIS — D649 Anemia, unspecified: Secondary | ICD-10-CM

## 2013-08-09 DIAGNOSIS — D72829 Elevated white blood cell count, unspecified: Secondary | ICD-10-CM | POA: Diagnosis present

## 2013-08-09 LAB — CBC
MCH: 30.4 pg (ref 26.0–34.0)
MCV: 91.6 fL (ref 78.0–100.0)
Platelets: 375 10*3/uL (ref 150–400)
RBC: 3.92 MIL/uL (ref 3.87–5.11)
RDW: 13.3 % (ref 11.5–15.5)

## 2013-08-09 LAB — BASIC METABOLIC PANEL
BUN: 11 mg/dL (ref 6–23)
CO2: 26 mEq/L (ref 19–32)
Calcium: 9 mg/dL (ref 8.4–10.5)
Creatinine, Ser: 0.94 mg/dL (ref 0.50–1.10)
Glucose, Bld: 95 mg/dL (ref 70–99)

## 2013-08-09 LAB — C-REACTIVE PROTEIN: CRP: 11.2 mg/dL — ABNORMAL HIGH (ref <0.60)

## 2013-08-09 MED ORDER — VANCOMYCIN HCL 10 G IV SOLR
1500.0000 mg | Freq: Two times a day (BID) | INTRAVENOUS | Status: DC
  Administered 2013-08-10: 1500 mg via INTRAVENOUS
  Filled 2013-08-09 (×2): qty 1500

## 2013-08-09 MED ORDER — MORPHINE SULFATE 2 MG/ML IJ SOLN
1.0000 mg | Freq: Four times a day (QID) | INTRAMUSCULAR | Status: DC | PRN
  Administered 2013-08-09: 1 mg via INTRAVENOUS
  Filled 2013-08-09: qty 1

## 2013-08-09 NOTE — Progress Notes (Signed)
[facility] PROGRESS NOTE  Megan Hardin ZOX:[id number] DOB: May 19, 1981 DOA: 08/08/2013 PCP: Provider Not In System  Brief narrative: 32 -year-old female with no significant past medical history who presented with worsening redness around left mid shin area for past 3 days prior to this admission. This has spread down the left leg and resulted in swelling and tenderness at the site. Patient was seen in any 10 emergency department and was given Keflex and Bactrim and Vicodin for pain. Patient did not tolerate by mouth medications and ultimately came back to [facility] ED for further treatment.  Assessment/Plan:  Principal Problem:   Bilateral cellulitis of lower leg - Continue current antibiotics, vancomycin - May continue IV fluids Active Problems:   Leukocytosis, unspecified - Secondary to lower extremity cellulitis - Manage with antibiotics as above   Anemia - Hemoglobin stable at 11.9   Code Status: full code Family Communication: no family at the bedside Disposition Plan: home when stable  Manson Passey, MD  [facility] Pager [phone]  If 7PM-7AM, please contact night-coverage [email or web address] Password [email or web address] 08/09/2013, 2:04 PM   LOS: 1 day   Consultants:  None   Procedures:  None   Antibiotics:  Vancomycin 08/08/2013 -->  HPI/Subjective: No overnight events.  Objective: Filed Vitals:   08/08/13 1020 08/08/13 1254 08/08/13 2223 08/09/13 0524  BP: 148/87 143/70 122/86 135/68  Pulse: 89 89 70 92  Temp: 98.2 F (36.8 C) 97.9 F (36.6 C) 97.6 F (36.4 C) 98.1 F (36.7 C)  TempSrc: Oral  Oral Oral  Resp: 16 18 20 20   Height:  5' 8.5" (1.74 m)    Weight:  164.792 kg (363 lb 4.8 oz)    SpO2: 99% 98% 100% 100%    Intake/Output Summary (Last 24 hours) at 08/09/13 1404 Last data filed at 08/09/13 1100  Gross per 24 hour  Intake    900 ml  Output    750 ml  Net    150 ml    Exam:   General:  Pt is alert, follows commands appropriately, not in acute  distress  Cardiovascular: Regular rate and rhythm, S1/S2, no murmurs, no rubs, no gallops  Respiratory: Clear to auscultation bilaterally, no wheezing, no crackles, no rhonchi  Abdomen: Soft, non tender, non distended, bowel sounds present, no guarding  Extremities: No edema, pulses DP and PT palpable bilaterally  Neuro: Grossly nonfocal  Data Reviewed: Basic Metabolic Panel:  Recent Labs Lab 08/07/13 0933 08/08/13 1110 08/08/13 2006 08/09/13 0404  NA 135 138  --  134*  K 4.4 4.2  --  4.3  CL 100 103  --  102  CO2 23 28  --  26  GLUCOSE 113* 124*  --  95  BUN 8 13  --  11  CREATININE 0.78 0.98 0.91 0.94  CALCIUM 9.5 9.6  --  9.0   Liver Function Tests: No results found for this basename: AST, ALT, ALKPHOS, BILITOT, PROT, ALBUMIN,  in the last 168 hours No results found for this basename: LIPASE, AMYLASE,  in the last 168 hours No results found for this basename: AMMONIA,  in the last 168 hours CBC:  Recent Labs Lab 08/07/13 0933 08/08/13 1110 08/08/13 2006 08/09/13 0404  WBC 13.7* 10.9* 10.8* 7.8  NEUTROABS 11.0*  --   --   --   HGB 13.2 12.9 11.3* 11.9*  HCT 38.8 37.9 33.5* 35.9*  MCV 90.9 91.8 90.8 91.6  PLT 364 393 342 375   Cardiac Enzymes: No results found  for this basename: CKTOTAL, CKMB, CKMBINDEX, TROPONINI,  in the last 168 hours BNP: No components found with this basename: POCBNP,  CBG:  Recent Labs Lab 08/06/13 2147  GLUCAP 113*    No results found for this or any previous visit (from the past 240 hour(s)).   Studies: Dg Knee 1-2 Views Left  08/22/2013   *RADIOLOGY REPORT*  Clinical Data: Bilateral knee pain for 3 days.  LEFT KNEE - 1-2 VIEW  Comparison: None.  Findings: No acute bony or joint abnormality is identified.  There is some medial compartment joint space narrowing.  No effusion.  IMPRESSION: Medial compartment degenerative change.  No acute finding.   Original Report Authenticated By: Holley Dexter, M.D.   Dg Knee 1-2 Views  Right  2013/08/22   *RADIOLOGY REPORT*  Clinical Data: Bilateral knee pain for 3 days.  RIGHT KNEE - 1-2 VIEW  Comparison: None.  Findings: Imaged bones, joints and soft tissues appear normal.  IMPRESSION: Negative exam.   Original Report Authenticated By: Holley Dexter, M.D.    Scheduled Meds: . enoxaparin (LOVENOX) injection  80 mg Subcutaneous Q24H  . norgestrel-ethinyl estradiol  1 tablet Oral Daily  . [START ON 08/10/2013] vancomycin  1,500 mg Intravenous Q12H   Continuous Infusions:

## 2013-08-09 NOTE — Progress Notes (Signed)
Clinical Social Work Department BRIEF PSYCHOSOCIAL ASSESSMENT 08/09/2013  Patient:  Megan Hardin, Megan Hardin     Account Number:  [id number]     Admit date:  08/08/2013  Clinical Social Worker:  Jacelyn Grip  Date/Time:  08/09/2013 11:30 AM  Referred by:  Physician  Date Referred:  08/09/2013 Referred for  Other - See comment   Other Referral:   insurance assistance   Interview type:  Patient Other interview type:    PSYCHOSOCIAL DATA Living Status:  HUSBAND Admitted from facility:   Level of care:   Primary support name:  Gerri Spore Artley/[phone] Primary support relationship to patient:  SPOUSE Degree of support available:   adequate    CURRENT CONCERNS Current Concerns  Other - See comment   Other Concerns:    SOCIAL WORK ASSESSMENT / PLAN CSW received notification that pt needs assistance with insurance.    CSW met with pt at bedside to discuss. Pt stated that she and her spouse had insurance through [facility] through [employer] [facility] Act, but the cost ended up being more than pt and pt spouse could afford and they cancelled the insurance.    Pt states that she is interested in finding PCP that does not require insurance and had questions re: hospital bill. CSW discussed that pt could contact phone number on the bottom of bill in order to set up payment plan.    CSW notified RNCM of pt request for information about PCP.    CSW contacted financial counselor re: pt self pay status.    Pt expressed appreciation for visit.    No further social work needs identified.    CSW signing off.   Assessment/plan status:  Psychosocial Support/Ongoing Assessment of Needs Other assessment/ plan:   discharge planning   Information/referral to community resources:   Referral to [facility]    PATIENT'S/FAMILY'S RESPONSE TO PLAN OF CARE: Pt alert and oriented x 4. Pt has been Forensic psychologist options, but unfortunately the affordable care act plan that pt had was  no financially sound for pt. Pt is eager to get PCP that does not require her to have insurance in order to still be followed medically as she is navigating her insurance options.    CSW signing off.       Jacklynn Lewis, MSW, LCSWA  Clinical Social Work [phone]

## 2013-08-09 NOTE — Progress Notes (Signed)
CARE MANAGEMENT NOTE 08/09/2013  Patient:  Megan Hardin, Megan Hardin   Account Number:  [id number]  Date Initiated:  08/09/2013  Documentation initiated by:  [facility]  Subjective/Objective Assessment:   32 year old female admitted with cellulitis that failed outpatient treatment.     Action/Plan:   From home.   Anticipated DC Date:  08/12/2013   Anticipated DC Plan:  HOME/SELF CARE    DC Planning Services  Indigent Health Clinic  CM consult      Choice offered to / List presented to:  C-1 Patient      Status of service:  In process, will continue to follow Medicare Important Message given?  NA - LOS <3 / Initial given by admission  Per UR Regulation:  Reviewed for med. necessity/level of care/duration of stay  Comments:  08/09/13 Algernon Huxley RN BSN [phone] I provided pt with a list of indigent clinics in [facility] as well as information on [employer] and [employer].

## 2013-08-10 MED ORDER — DOXYCYCLINE HYCLATE 100 MG PO TABS: 100.0000 mg | ORAL_TABLET | Freq: Two times a day (BID) | ORAL | Status: DC

## 2013-08-10 MED ORDER — HYDROCODONE-ACETAMINOPHEN 5-325 MG PO TABS: 1.0000 | ORAL_TABLET | ORAL | Status: DC | PRN

## 2013-08-10 NOTE — Progress Notes (Signed)
08/10/13 1301 Reviewed discharge instructions with patient. Patient verbalized understanding. Copy of discharge papers and prescriptions given to patient.

## 2013-08-10 NOTE — Discharge Summary (Addendum)
Physician Discharge Summary  Megan Hardin WUJ:[id number] DOB: [birth year] DOA: 08/08/2013  PCP: Provider Not In System  Admit date: 08/08/2013 Discharge date: 08/10/2013  Recommendations for Outpatient Follow-up:  Discussed with the patient and all questioned fully answered. She will call  if any problems arise. Discharge Orders   Future Appointments Provider Department Dept Phone   08/12/2013 1:30 PM Lazaro Arms, MD [facility] [phone]   Future Orders Complete By Expires   Call MD for:  difficulty breathing, headache or visual disturbances  As directed    Call MD for:  persistant dizziness or light-headedness  As directed    Call MD for:  persistant nausea and vomiting  As directed    Call MD for:  severe uncontrolled pain  As directed    Diet - low sodium heart healthy  As directed    Discharge instructions  As directed    Comments:     1. Continue taking doxycyline 100 mg twice a day for cellulitis and follow up with your PCP in 1-2 weeks after discharge to make sure symptoms are improving 2. May take norco as needed for pain control 3. Stop taking bactrim and keflex   Increase activity slowly  As directed       Discharge Diagnoses:  Principal Problem:   Bilateral cellulitis of lower leg Active Problems:   Leukocytosis, unspecified   Anemia    Discharge Condition: medically table for discharge home today  Diet recommendation: as tolerated  History of present illness:  32 -year-old female with no significant past medical history who presented with worsening redness around left mid shin area for past 3 days prior to this admission. This has spread down the left leg and resulted in swelling and tenderness at the site. Patient was seen in any 10 emergency department and was given Keflex and Bactrim and Vicodin for pain. Patient did not tolerate by mouth medications and ultimately came back to [facility] ED for further treatment. Pt received 3 days of vancomycin and will get  doxycycline for 7 days on discharge 100 mg BID.   Assessment/Plan:   Principal Problem:  Bilateral cellulitis of lower leg  - received 3 days of vanco and will stop it today. Continue doxycyline 100 mg BID for 7 more days. She was on bactrim and keflex prior to this admission and that did not result in significant improvement  Active Problems:  Leukocytosis, unspecified  - Secondary to lower extremity cellulitis  - WBC count is WNL Anemia  - Hemoglobin stable at 11.9   Code Status: full code  Family Communication: no family at the bedside  Disposition Plan: home today  Manson Passey, MD  [facility]  Pager [phone]    Consultants:  None  Procedures:  None  Antibiotics:  Vancomycin 08/08/2013 -->08/10/2013 Doxycyline 100 mg PO BID for 7 days on discharge  Signed:  Manson Passey, MD  [facility] 08/10/2013, 8:57 AM  Pager #: [phone]  Discharge Exam: Filed Vitals:   08/10/13 0557  BP: 133/60  Pulse: 67  Temp: 97.4 F (36.3 C)  Resp: 18   Filed Vitals:   08/09/13 0524 08/09/13 1400 08/09/13 2100 08/10/13 0557  BP: 135/68 126/70 118/56 133/60  Pulse: 92 71 75 67  Temp: 98.1 F (36.7 C) 98.5 F (36.9 C) 97.6 F (36.4 C) 97.4 F (36.3 C)  TempSrc: Oral Oral Oral Oral  Resp: 20 18 18 18   Height:      Weight:  SpO2: 100% 99% 100% 100%    General: Pt is alert, follows commands appropriately, not in acute distress Cardiovascular: Regular rate and rhythm, S1/S2 +, no murmurs, no rubs, no gallops Respiratory: Clear to auscultation bilaterally, no wheezing, no crackles, no rhonchi Abdominal: Soft, non tender, non distended, bowel sounds +, no guarding Extremities: no edema, no cyanosis, pulses palpable bilaterally DP and PT; LE cellulitis significantly improved Neuro: Grossly nonfocal  Discharge Instructions   Future Appointments Provider Department Dept Phone   08/12/2013 1:30 PM Lazaro Arms, MD [facility] [phone]        Medication List    STOP taking these medications       cephALEXin 500 MG capsule  Commonly known as:  KEFLEX     sulfamethoxazole-trimethoprim 800-160 MG per tablet  Commonly known as:  SEPTRA DS      TAKE these medications       doxycycline 100 MG tablet  Commonly known as:  VIBRA-TABS  Take 1 tablet (100 mg total) by mouth 2 (two) times daily.     HYDROcodone-acetaminophen 5-325 MG per tablet  Commonly known as:  NORCO/VICODIN  Take 1 tablet by mouth every 4 (four) hours as needed for pain.     norgestrel-ethinyl estradiol 0.3-30 MG-MCG tablet  Commonly known as:  LO/OVRAL,CRYSELLE  Take 1 tablet by mouth daily.     SLEEP AID PO  Take 1 tablet by mouth at bedtime.          The results of significant diagnostics from this hospitalization (including imaging, microbiology, ancillary and laboratory) are listed below for reference.    Significant Diagnostic Studies: Dg Knee 1-2 Views Left  Aug 10, 2013   *RADIOLOGY REPORT*  Clinical Data: Bilateral knee pain for 3 days.  LEFT KNEE - 1-2 VIEW  Comparison: None.  Findings: No acute bony or joint abnormality is identified.  There is some medial compartment joint space narrowing.  No effusion.  IMPRESSION: Medial compartment degenerative change.  No acute finding.   Original Report Authenticated By: Holley Dexter, M.D.   Dg Knee 1-2 Views Right  2013-08-10   *RADIOLOGY REPORT*  Clinical Data: Bilateral knee pain for 3 days.  RIGHT KNEE - 1-2 VIEW  Comparison: None.  Findings: Imaged bones, joints and soft tissues appear normal.  IMPRESSION: Negative exam.   Original Report Authenticated By: Holley Dexter, M.D.    Microbiology: No results found for this or any previous visit (from the past 240 hour(s)).   Labs: Basic Metabolic Panel:  Recent Labs Lab 08/07/13 0933 08-10-13 1110 08/10/13 2006 08/09/13 0404  NA 135 138  --  134*  K 4.4 4.2  --  4.3  CL 100 103  --  102  CO2 23 28  --  26  GLUCOSE 113* 124*  --  95   BUN 8 13  --  11  CREATININE 0.78 0.98 0.91 0.94  CALCIUM 9.5 9.6  --  9.0   Liver Function Tests: No results found for this basename: AST, ALT, ALKPHOS, BILITOT, PROT, ALBUMIN,  in the last 168 hours No results found for this basename: LIPASE, AMYLASE,  in the last 168 hours No results found for this basename: AMMONIA,  in the last 168 hours CBC:  Recent Labs Lab 08/07/13 0933 08-10-13 1110 August 10, 2013 2006 08/09/13 0404  WBC 13.7* 10.9* 10.8* 7.8  NEUTROABS 11.0*  --   --   --   HGB 13.2 12.9 11.3* 11.9*  HCT 38.8 37.9 33.5* 35.9*  MCV 90.9  91.8 90.8 91.6  PLT 364 393 342 375   Cardiac Enzymes: No results found for this basename: CKTOTAL, CKMB, CKMBINDEX, TROPONINI,  in the last 168 hours BNP: BNP (last 3 results) No results found for this basename: PROBNP,  in the last 8760 hours CBG:  Recent Labs Lab 08/06/13 2147  GLUCAP 113*    Time coordinating discharge: Over 30 minutes

## 2013-08-11 ENCOUNTER — Encounter (HOSPITAL_COMMUNITY): Payer: Self-pay | Admitting: Emergency Medicine

## 2013-08-11 ENCOUNTER — Emergency Department (HOSPITAL_COMMUNITY)
Admission: EM | Admit: 2013-08-11 | Discharge: 2013-08-11 | Disposition: A | Discharge status: Home / Self Care | Payer: Self-pay | Attending: Emergency Medicine | Admitting: Emergency Medicine

## 2013-08-11 DIAGNOSIS — L539 Erythematous condition, unspecified: Secondary | ICD-10-CM | POA: Insufficient documentation

## 2013-08-11 DIAGNOSIS — Z87891 Personal history of nicotine dependence: Secondary | ICD-10-CM | POA: Insufficient documentation

## 2013-08-11 DIAGNOSIS — T364X5A Adverse effect of tetracyclines, initial encounter: Secondary | ICD-10-CM | POA: Insufficient documentation

## 2013-08-11 DIAGNOSIS — Z79899 Other long term (current) drug therapy: Secondary | ICD-10-CM | POA: Insufficient documentation

## 2013-08-11 DIAGNOSIS — Z8742 Personal history of other diseases of the female genital tract: Secondary | ICD-10-CM | POA: Insufficient documentation

## 2013-08-11 DIAGNOSIS — T7840XA Allergy, unspecified, initial encounter: Secondary | ICD-10-CM

## 2013-08-11 MED ORDER — PREDNISONE 20 MG PO TABS
60.0000 mg | ORAL_TABLET | Freq: Once | ORAL | Status: AC
  Administered 2013-08-11: 60 mg via ORAL
  Filled 2013-08-11: qty 3

## 2013-08-11 MED ORDER — DIPHENHYDRAMINE HCL 25 MG PO CAPS
50.0000 mg | ORAL_CAPSULE | Freq: Once | ORAL | Status: AC
  Administered 2013-08-11: 50 mg via ORAL
  Filled 2013-08-11: qty 2

## 2013-08-11 MED ORDER — CLINDAMYCIN HCL 150 MG PO CAPS: 150.0000 mg | ORAL_CAPSULE | Freq: Four times a day (QID) | ORAL | Status: DC

## 2013-08-11 MED ORDER — PREDNISONE 10 MG PO TABS: 20.0000 mg | ORAL_TABLET | Freq: Every day | ORAL | Status: DC

## 2013-08-11 NOTE — ED Notes (Signed)
She tells me she has some redness and tenderness of hands and feet.  She states she has been told by our doctor that he feels this is an allergic reaction.  This does appear to be allergy-type erythema, itching and burning of her bilat hands, especially at thumb web areas.  Secondarily, she has recently been treated for cellulitis at [facility]; and pt. And her husband are concerned about the persistence of the cellulitis.  I look at her records, which show her WBC count to have normalized; further, she has medical skin marks on her left lower leg; from which the erythema from the cellulitis has significantly retreated.  With this information they seem reassured.  I acknowlege their frustration at the time this is taking to resolve, but again assure them that by all appearances we are making progress.

## 2013-08-11 NOTE — ED Provider Notes (Signed)
CSN: [id number]     Arrival date & time 08/11/13  1327 History   First MD Initiated Contact with Patient 08/11/13 1343     Chief Complaint  Patient presents with  . Allergic Reaction   (Consider location/radiation/quality/duration/timing/severity/associated sxs/prior Treatment) Patient is a 32 y.o. female presenting with allergic reaction. The history is provided by the patient.  Allergic Reaction  Patient here complaining of hand pruritus with erythema at the base of both thumbs. Denies any hives. No tingling anywhere else on her body. No skin lesions on her chest or torso. Discharge from the hospital yesterday after an admission for saline is from her lower extremity as which he says has dramatically improved. Denies any lesions in her mouth. No shortness of breath. No throat swelling or tightness. Symptoms have been persistent and nothing makes them better worse. No treatment used prior to arrival. Denies any use of new chemicals. Past Medical History  Diagnosis Date  . PCOS (polycystic ovarian syndrome)    Past Surgical History  Procedure Laterality Date  . Eye surgery Left    Family History  Problem Relation Age of Onset  . Diabetes Mother   . Hypertension Father   . Diabetes Maternal Grandmother   . Diabetes Maternal Grandfather    History  Substance Use Topics  . Smoking status: Former Smoker    Types: Cigarettes  . Smokeless tobacco: Never Used  . Alcohol Use: Yes     Comment: socially   OB History   Grav Para Term Preterm Abortions TAB SAB Ect Mult Living                 Review of Systems  All other systems reviewed and are negative.    Allergies  Dilaudid  Home Medications   Current Outpatient Rx  Name  Route  Sig  Dispense  Refill  . clotrimazole (LOTRIMIN) 1 % cream   Topical   Apply 1 application topically 2 (two) times daily as needed (apply between toes).         Marland Kitchen doxycycline (VIBRA-TABS) 100 MG tablet   Oral   Take 100 mg by mouth 2 (two)  times daily. For 7 days         . Doxylamine Succinate, Sleep, (SLEEP AID PO)   Oral   Take 1 tablet by mouth at bedtime.          Marland Kitchen HYDROcodone-acetaminophen (NORCO/VICODIN) 5-325 MG per tablet   Oral   Take 1 tablet by mouth every 4 (four) hours as needed for pain.   45 tablet   0   . norgestrel-ethinyl estradiol (LO/OVRAL,CRYSELLE) 0.3-30 MG-MCG tablet   Oral   Take 1 tablet by mouth daily.   1 Package   11    BP 173/101  Pulse 100  Temp(Src) 97.7 F (36.5 C) (Oral)  Resp 18  SpO2 99%  LMP 07/06/2013 Physical Exam  Nursing note and vitals reviewed. Constitutional: She is oriented to person, place, and time. She appears well-developed and well-nourished.  Non-toxic appearance. No distress.  HENT:  Head: Normocephalic and atraumatic.  Eyes: Conjunctivae, EOM and lids are normal. Pupils are equal, round, and reactive to light.  Neck: Normal range of motion. Neck supple. No tracheal deviation present. No mass present.  Cardiovascular: Normal rate, regular rhythm and normal heart sounds.  Exam reveals no gallop.   No murmur heard. Pulmonary/Chest: Effort normal and breath sounds normal. No stridor. No respiratory distress. She has no decreased breath sounds. She has no  wheezes. She has no rhonchi. She has no rales.  Abdominal: Soft. Normal appearance and bowel sounds are normal. She exhibits no distension. There is no tenderness. There is no rebound and no CVA tenderness.  Musculoskeletal: Normal range of motion. She exhibits no edema and no tenderness.  Slight erythema noted at base of both thumbs. No desquamation noted. No hives.   Neurological: She is alert and oriented to person, place, and time. She has normal strength. No cranial nerve deficit or sensory deficit. GCS eye subscore is 4. GCS verbal subscore is 5. GCS motor subscore is 6.  Skin: Skin is warm and dry. No abrasion and no rash noted. Rash is not papular, not vesicular and not urticarial.  Psychiatric: She  has a normal mood and affect. Her speech is normal and behavior is normal.    ED Course  Procedures (including critical care time) Labs Review Labs Reviewed - No data to display Imaging Review No results found.  MDM  No diagnosis found.  Patient given Benadryl here. Will have patient stop taking doxycycline as this may cause patient to develop Stevens-Johnson syndrome. Will place patient on clindamycin for infection.  Toy Baker, MD 08/11/13 1426

## 2013-08-11 NOTE — ED Notes (Signed)
Pt states that she was d/c yesterday from hospital for cellulitis and started on doxy. Pt is having itching to hands and burning .Bilower legsare better from cellulitits. Denies any sob,

## 2013-08-12 ENCOUNTER — Ambulatory Visit (INDEPENDENT_AMBULATORY_CARE_PROVIDER_SITE_OTHER): Payer: Self-pay | Admitting: Obstetrics & Gynecology

## 2013-08-12 ENCOUNTER — Encounter: Payer: Self-pay | Admitting: Obstetrics & Gynecology

## 2013-08-12 VITALS — BP 130/80 | Wt 362.0 lb

## 2013-08-12 DIAGNOSIS — N970 Female infertility associated with anovulation: Secondary | ICD-10-CM

## 2013-08-12 MED ORDER — CLOMIPHENE CITRATE 50 MG PO TABS: 50.0000 mg | ORAL_TABLET | Freq: Every day | ORAL | Status: DC

## 2013-08-12 NOTE — Progress Notes (Signed)
Patient ID: Megan Hardin, female   DOB: 1981/12/01, 32 y.o.   MRN: [id number] As anticipated the patient has had irregular menses with her Sprintec appropriate based on the peel We will now transition her from pills to Clomid and she is given a prescription for that She will start her Clomid day 1 and her first day of bleeding with this cycle She doesn't start. Within about 40 days she will call me he will probably bump up the dose

## 2013-08-12 NOTE — ED Provider Notes (Signed)
Medical screening examination/treatment/procedure(s) were performed by non-physician practitioner and as supervising physician I was immediately available for consultation/collaboration.   Benny Lennert, MD 08/12/13 [phone]

## 2013-10-10 ENCOUNTER — Other Ambulatory Visit: Payer: Self-pay

## 2013-10-14 ENCOUNTER — Telehealth: Payer: Self-pay

## 2013-10-14 NOTE — Telephone Encounter (Signed)
Pt states did have a period last month and finishing period now. Pt states do you want her to take her OCP with clomid if so needs refill.

## 2013-10-14 NOTE — Telephone Encounter (Signed)
Pt states negative pregnancy test, been taking clomid as prescribed by Dr. Despina Hidden. Pt states will she need a follow up appt or will Dr. Despina Hidden increase her clomid dosage without being seen.

## 2013-10-14 NOTE — Telephone Encounter (Signed)
Period or no period? If no period needs to take provera 10 mg x 10 days first to start her menses, then we will increase the clomid dosage  Please message back to let me know

## 2013-10-15 MED ORDER — CLOMIPHENE CITRATE 50 MG PO TABS: ORAL_TABLET | ORAL | Status: DC

## 2013-11-20 ENCOUNTER — Other Ambulatory Visit: Payer: Self-pay | Admitting: Obstetrics & Gynecology

## 2013-11-20 ENCOUNTER — Encounter: Payer: Self-pay | Admitting: Adult Health

## 2013-11-20 ENCOUNTER — Ambulatory Visit (INDEPENDENT_AMBULATORY_CARE_PROVIDER_SITE_OTHER): Payer: Medicaid Other | Admitting: Adult Health

## 2013-11-20 VITALS — BP 148/72 | Ht 68.5 in | Wt 368.0 lb

## 2013-11-20 DIAGNOSIS — Z3201 Encounter for pregnancy test, result positive: Secondary | ICD-10-CM

## 2013-11-20 DIAGNOSIS — O3680X0 Pregnancy with inconclusive fetal viability, not applicable or unspecified: Secondary | ICD-10-CM

## 2013-11-20 NOTE — Progress Notes (Signed)
Patient ID: Megan Hardin, female   DOB: 01-31-1981, 32 y.o.   MRN: [id number] Pt here for pregnancy test, resulted positive. Pt to come back in 1-2 weeks for new ob.

## 2013-12-04 ENCOUNTER — Other Ambulatory Visit: Payer: Self-pay | Admitting: Obstetrics & Gynecology

## 2013-12-04 ENCOUNTER — Ambulatory Visit (INDEPENDENT_AMBULATORY_CARE_PROVIDER_SITE_OTHER): Payer: Medicaid Other

## 2013-12-04 ENCOUNTER — Other Ambulatory Visit: Payer: Medicaid Other

## 2013-12-04 DIAGNOSIS — O26849 Uterine size-date discrepancy, unspecified trimester: Secondary | ICD-10-CM

## 2013-12-04 DIAGNOSIS — O3680X0 Pregnancy with inconclusive fetal viability, not applicable or unspecified: Secondary | ICD-10-CM

## 2013-12-04 DIAGNOSIS — Z3202 Encounter for pregnancy test, result negative: Secondary | ICD-10-CM

## 2013-12-05 LAB — PROGESTERONE: Progesterone: 5.7 ng/mL

## 2013-12-05 LAB — HCG, QUANTITATIVE, PREGNANCY: hCG, Beta Chain, Quant, S: 256 m[IU]/mL

## 2013-12-06 ENCOUNTER — Telehealth: Payer: Self-pay | Admitting: Adult Health

## 2013-12-06 MED ORDER — PROGESTERONE MICRONIZED 200 MG PO CAPS: ORAL_CAPSULE | ORAL | Status: DC

## 2013-12-06 NOTE — Telephone Encounter (Signed)
Pt aware of labs will rx Prometrium 200 mg 1 at hs in vagina for progesterone level of 5.7

## 2013-12-11 ENCOUNTER — Telehealth: Payer: Self-pay

## 2013-12-11 NOTE — Telephone Encounter (Signed)
Prometrium over $100 can't avoid will rx progesterone 50 mg supp 1 bid  In vagina #30 with 4 refills at [facility]

## 2013-12-13 ENCOUNTER — Other Ambulatory Visit: Payer: Self-pay | Admitting: Obstetrics & Gynecology

## 2013-12-13 DIAGNOSIS — O3680X0 Pregnancy with inconclusive fetal viability, not applicable or unspecified: Secondary | ICD-10-CM

## 2013-12-18 ENCOUNTER — Encounter: Payer: Self-pay | Admitting: Adult Health

## 2013-12-18 ENCOUNTER — Ambulatory Visit (INDEPENDENT_AMBULATORY_CARE_PROVIDER_SITE_OTHER): Payer: Medicaid Other | Admitting: Adult Health

## 2013-12-18 ENCOUNTER — Other Ambulatory Visit: Payer: Self-pay | Admitting: Obstetrics & Gynecology

## 2013-12-18 ENCOUNTER — Ambulatory Visit (INDEPENDENT_AMBULATORY_CARE_PROVIDER_SITE_OTHER): Payer: Medicaid Other

## 2013-12-18 VITALS — BP 138/72 | Ht 68.5 in | Wt 357.2 lb

## 2013-12-18 DIAGNOSIS — O200 Threatened abortion: Secondary | ICD-10-CM

## 2013-12-18 DIAGNOSIS — O3680X0 Pregnancy with inconclusive fetal viability, not applicable or unspecified: Secondary | ICD-10-CM

## 2013-12-18 DIAGNOSIS — O0900 Supervision of pregnancy with history of infertility, unspecified trimester: Secondary | ICD-10-CM

## 2013-12-18 LAB — HCG, QUANTITATIVE, PREGNANCY: hCG, Beta Chain, Quant, S: 2816.1 m[IU]/mL

## 2013-12-18 NOTE — Patient Instructions (Signed)
Follow up in 1 week for [place] Will call in am for labs  Miscarriage A miscarriage is the sudden loss of an unborn baby (fetus) before the 20th week of pregnancy. Most miscarriages happen in the first 3 months of pregnancy. Sometimes, it happens before a woman even knows she is pregnant. A miscarriage is also called a "spontaneous miscarriage" or "early pregnancy loss." Having a miscarriage can be an emotional experience. Talk with your caregiver about any questions you may have about miscarrying, the grieving process, and your future pregnancy plans. CAUSES   Problems with the fetal chromosomes that make it impossible for the baby to develop normally. Problems with the baby's genes or chromosomes are most often the result of errors that occur, by chance, as the embryo divides and grows. The problems are not inherited from the parents.  Infection of the cervix or uterus.   Hormone problems.   Problems with the cervix, such as having an incompetent cervix. This is when the tissue in the cervix is not strong enough to hold the pregnancy.   Problems with the uterus, such as an abnormally shaped uterus, uterine fibroids, or congenital abnormalities.   Certain medical conditions.   Smoking, drinking alcohol, or taking illegal drugs.   Trauma.  Often, the cause of a miscarriage is unknown.  SYMPTOMS   Vaginal bleeding or spotting, with or without cramps or pain.  Pain or cramping in the abdomen or lower back.  Passing fluid, tissue, or blood clots from the vagina. DIAGNOSIS  Your caregiver will perform a physical exam. You may also have an ultrasound to confirm the miscarriage. Blood or urine tests may also be ordered. TREATMENT   Sometimes, treatment is not necessary if you naturally pass all the fetal tissue that was in the uterus. If some of the fetus or placenta remains in the body (incomplete miscarriage), tissue left behind may become infected and must be removed. Usually, a  dilation and curettage (D and C) procedure is performed. During a D and C procedure, the cervix is widened (dilated) and any remaining fetal or placental tissue is gently removed from the uterus.  Antibiotic medicines are prescribed if there is an infection. Other medicines may be given to reduce the size of the uterus (contract) if there is a lot of bleeding.  If you have Rh negative blood and your baby was Rh positive, you will need a Rh immunoglobulin shot. This shot will protect any future baby from having Rh blood problems in future pregnancies. HOME CARE INSTRUCTIONS   Your caregiver may order bed rest or may allow you to continue light activity. Resume activity as directed by your caregiver.  Have someone help with home and family responsibilities during this time.   Keep track of the number of sanitary pads you use each day and how soaked (saturated) they are. Write down this information.   Do not use tampons. Do not douche or have sexual intercourse until approved by your caregiver.   Only take over-the-counter or prescription medicines for pain or discomfort as directed by your caregiver.   Do not take aspirin. Aspirin can cause bleeding.   Keep all follow-up appointments with your caregiver.   If you or your partner have problems with grieving, talk to your caregiver or seek counseling to help cope with the pregnancy loss. Allow enough time to grieve before trying to get pregnant again.  SEEK IMMEDIATE MEDICAL CARE IF:   You have severe cramps or pain in your back  or abdomen.  You have a fever.  You pass large blood clots (walnut-sized or larger) ortissue from your vagina. Save any tissue for your caregiver to inspect.   Your bleeding increases.   You have a thick, bad-smelling vaginal discharge.  You become lightheaded, weak, or you faint.   You have chills.  MAKE SURE YOU:  Understand these instructions.  Will watch your condition.  Will get help  right away if you are not doing well or get worse. Document Released: 05/17/2001 Document Revised: 03/18/2013 Document Reviewed: 01/10/2012 [facility] Patient Information 2014 [place], [place].

## 2013-12-18 NOTE — Progress Notes (Signed)
Subjective:     Patient ID: Megan Hardin, female   DOB: [birth year], 33 y.o.   MRN: [id number]  HPI Megan Hardin is a 33 year old white female, married in for US to monitor growth of sac.Using Prometrium. She thinks she is 0+.  Review of Systems See HPI Reviewed past medical,surgical, social and family history. Reviewed medications and allergies.     Objective:   Physical Exam BP 138/72  Ht 5' 8.5" (1.74 m)  Wt 357 lb 3.2 oz (162.025 kg)  BMI 53.52 kg/m2  LMP 10/13/2013   US showed small growth in sac no YS or fetal pole, no bleeding, pt aware, and we discussed getting QHCG today and follow up US in 1 week, told her this could be miscarriage but we will monitor for now and she agrees Assessment:     Threatened miscarrage   Plan:    Check QHCG Continue Prometrium for now Follow up in 1 week for US Call any problems, bleeding or questions, will call her in am with labs results   Review handout on miscarriage

## 2013-12-19 ENCOUNTER — Telehealth: Payer: Self-pay | Admitting: Adult Health

## 2013-12-19 NOTE — Telephone Encounter (Signed)
Left message about labs, keep appt for [place]

## 2013-12-25 ENCOUNTER — Ambulatory Visit (INDEPENDENT_AMBULATORY_CARE_PROVIDER_SITE_OTHER): Payer: Medicaid Other | Admitting: Adult Health

## 2013-12-25 ENCOUNTER — Encounter: Payer: Self-pay | Admitting: Adult Health

## 2013-12-25 ENCOUNTER — Other Ambulatory Visit: Payer: Medicaid Other

## 2013-12-25 ENCOUNTER — Ambulatory Visit: Payer: Medicaid Other | Admitting: Adult Health

## 2013-12-25 ENCOUNTER — Ambulatory Visit (INDEPENDENT_AMBULATORY_CARE_PROVIDER_SITE_OTHER): Payer: Medicaid Other

## 2013-12-25 VITALS — BP 140/86 | Ht 68.5 in | Wt 357.0 lb

## 2013-12-25 DIAGNOSIS — O200 Threatened abortion: Secondary | ICD-10-CM

## 2013-12-25 NOTE — Progress Notes (Signed)
Subjective:     Patient ID: Megan Hardin, female   DOB: Oct 02, 1981, 33 y.o.   MRN: [id number]  HPI Megan Hardin is a 33 year old white female, married in for follow up US for growth,her [employer] card says she is O+, she denies any bleeding or cramping.  Review of Systems See HPI Reviewed past medical,surgical, social and family history. Reviewed medications and allergies.     Objective:   Physical Exam BP 140/86  Ht 5' 8.5" (1.74 m)  Wt 357 lb (161.934 kg)  BMI 53.49 kg/m2  LMP 10/13/2013   ReviewedUS with her, GS 5+3 weeks, no growth or YS or fetal pole seen, discussed with Dr Despina HiddenEure and follow up in 1 week with 1 more US.Pt aware this could end in miscarriage, and she is to call with bleeding Assessment:     Threatened abortion    Plan:     Follow up in 1 week for US and see me Continue Prometrium

## 2013-12-25 NOTE — Patient Instructions (Signed)
Follow up in 1 week for [place]

## 2014-01-01 ENCOUNTER — Encounter: Payer: Self-pay | Admitting: Adult Health

## 2014-01-01 ENCOUNTER — Ambulatory Visit (INDEPENDENT_AMBULATORY_CARE_PROVIDER_SITE_OTHER): Payer: Medicaid Other

## 2014-01-01 ENCOUNTER — Ambulatory Visit (INDEPENDENT_AMBULATORY_CARE_PROVIDER_SITE_OTHER): Payer: Medicaid Other | Admitting: Adult Health

## 2014-01-01 VITALS — BP 148/80 | Ht 68.5 in | Wt 358.0 lb

## 2014-01-01 DIAGNOSIS — O200 Threatened abortion: Secondary | ICD-10-CM

## 2014-01-01 DIAGNOSIS — O039 Complete or unspecified spontaneous abortion without complication: Secondary | ICD-10-CM | POA: Insufficient documentation

## 2014-01-01 NOTE — Progress Notes (Signed)
Subjective:     Patient ID: Megan Hardin, female   DOB: [birth year], 33 y.o.   MRN: [id number]  HPI Megan Hardin is a 33 year old white female, married in for follow up US.She has IUP without fetal pole and has had low progesterone and is taking Prometrium, took last night but will stop now.  Review of Systems See HPI Reviewed past medical,surgical, social and family history. Reviewed medications and allergies.     Objective:   Physical Exam BP 148/80  Ht 5' 8.5" (1.74 m)  Wt 358 lb (162.388 kg)  BMI 53.64 kg/m2  Breastfeeding? Unknown   US showed flattening sac and no YS or fetal pole, pt aware this is miscarriage, will let it happen, without meds, to call when starts bleeding and will see if has period on own or if will need clomid again   Assessment:     Miscarriage    Plan:    Continue prenatals Call when bleeding starts, and keep period calendar Follow up in 3 months

## 2014-01-01 NOTE — Patient Instructions (Signed)
Miscarriage A miscarriage is the sudden loss of an unborn baby (fetus) before the 20th week of pregnancy. Most miscarriages happen in the first 3 months of pregnancy. Sometimes, it happens before a woman even knows she is pregnant. A miscarriage is also called a "spontaneous miscarriage" or "early pregnancy loss." Having a miscarriage can be an emotional experience. Talk with your caregiver about any questions you may have about miscarrying, the grieving process, and your future pregnancy plans. CAUSES   Problems with the fetal chromosomes that make it impossible for the baby to develop normally. Problems with the baby's genes or chromosomes are most often the result of errors that occur, by chance, as the embryo divides and grows. The problems are not inherited from the parents.  Infection of the cervix or uterus.   Hormone problems.   Problems with the cervix, such as having an incompetent cervix. This is when the tissue in the cervix is not strong enough to hold the pregnancy.   Problems with the uterus, such as an abnormally shaped uterus, uterine fibroids, or congenital abnormalities.   Certain medical conditions.   Smoking, drinking alcohol, or taking illegal drugs.   Trauma.  Often, the cause of a miscarriage is unknown.  SYMPTOMS   Vaginal bleeding or spotting, with or without cramps or pain.  Pain or cramping in the abdomen or lower back.  Passing fluid, tissue, or blood clots from the vagina. DIAGNOSIS  Your caregiver will perform a physical exam. You may also have an ultrasound to confirm the miscarriage. Blood or urine tests may also be ordered. TREATMENT   Sometimes, treatment is not necessary if you naturally pass all the fetal tissue that was in the uterus. If some of the fetus or placenta remains in the body (incomplete miscarriage), tissue left behind may become infected and must be removed. Usually, a dilation and curettage (D and C) procedure is performed.  During a D and C procedure, the cervix is widened (dilated) and any remaining fetal or placental tissue is gently removed from the uterus.  Antibiotic medicines are prescribed if there is an infection. Other medicines may be given to reduce the size of the uterus (contract) if there is a lot of bleeding.  If you have Rh negative blood and your baby was Rh positive, you will need a Rh immunoglobulin shot. This shot will protect any future baby from having Rh blood problems in future pregnancies. HOME CARE INSTRUCTIONS   Your caregiver may order bed rest or may allow you to continue light activity. Resume activity as directed by your caregiver.  Have someone help with home and family responsibilities during this time.   Keep track of the number of sanitary pads you use each day and how soaked (saturated) they are. Write down this information.   Do not use tampons. Do not douche or have sexual intercourse until approved by your caregiver.   Only take over-the-counter or prescription medicines for pain or discomfort as directed by your caregiver.   Do not take aspirin. Aspirin can cause bleeding.   Keep all follow-up appointments with your caregiver.   If you or your partner have problems with grieving, talk to your caregiver or seek counseling to help cope with the pregnancy loss. Allow enough time to grieve before trying to get pregnant again.  SEEK IMMEDIATE MEDICAL CARE IF:   You have severe cramps or pain in your back or abdomen.  You have a fever.  You pass large blood clots (walnut-sized   or larger) ortissue from your vagina. Save any tissue for your caregiver to inspect.   Your bleeding increases.   You have a thick, bad-smelling vaginal discharge.  You become lightheaded, weak, or you faint.   You have chills.  MAKE SURE YOU:  Understand these instructions.  Will watch your condition.  Will get help right away if you are not doing well or get  worse. Document Released: 05/17/2001 Document Revised: 03/18/2013 Document Reviewed: 01/10/2012 [facility] Patient Information 2014 [place]ExitCare, [place]LL[place]. Call when starts bleeding Keep period calendar Return in 3 months

## 2014-01-27 ENCOUNTER — Telehealth: Payer: Self-pay | Admitting: Adult Health

## 2014-01-27 NOTE — Telephone Encounter (Signed)
Pt had MAB several weeks ago, continues to have heavy bleeding and sharp pains. Appt made with Cyril MourningJennifer Griffin, NP. Offered an appt for tomorrow, pt states next day off will be Wednesday appt made for 1:15 pm.

## 2014-01-28 ENCOUNTER — Ambulatory Visit: Payer: Medicaid Other | Admitting: Adult Health

## 2014-01-29 ENCOUNTER — Ambulatory Visit (INDEPENDENT_AMBULATORY_CARE_PROVIDER_SITE_OTHER): Payer: Medicaid Other | Admitting: Adult Health

## 2014-01-29 ENCOUNTER — Ambulatory Visit (INDEPENDENT_AMBULATORY_CARE_PROVIDER_SITE_OTHER): Payer: Medicaid Other

## 2014-01-29 ENCOUNTER — Encounter: Payer: Self-pay | Admitting: Adult Health

## 2014-01-29 ENCOUNTER — Other Ambulatory Visit: Payer: Self-pay | Admitting: Adult Health

## 2014-01-29 VITALS — BP 140/64 | Ht 68.5 in | Wt 357.8 lb

## 2014-01-29 DIAGNOSIS — O039 Complete or unspecified spontaneous abortion without complication: Secondary | ICD-10-CM

## 2014-01-29 DIAGNOSIS — Z3201 Encounter for pregnancy test, result positive: Secondary | ICD-10-CM

## 2014-01-29 DIAGNOSIS — O200 Threatened abortion: Secondary | ICD-10-CM

## 2014-01-29 LAB — POCT URINE PREGNANCY
Preg Test, Ur: POSITIVE
Preg Test, Ur: POSITIVE

## 2014-01-29 NOTE — Patient Instructions (Signed)
Molar Pregnancy A molar pregnancy (hydatidiform mole) is a mass of tissue that grows in the uterus after conception. The mass is created by an egg that was not fertilized correctly and abnormally grows. It is an abnormal pregnancy and does not develop into a fetus. If a molar pregnancy is suspected by your health care provider, treatment is required. CAUSES  Molar pregnancy is caused by an egg that is fertilized incorrectly so that it has abnormal genetic material (chromosomes). This can result in one of 2 types of molar pregnancy:  Complete molar pregnancy All of the chromosomes in the fertilized egg come from the father; none come from the mother.  Partial molar pregnancy The fertilized egg has chromosomes from the father and mother, but it has too many chromosomes. RISK FACTORS  Certain risk factors make a molar pregnancy more likely. They include:   Being over age 33 or under age 33.  History of a molar pregnancy in the past (extremely small chance of recurrence). Other possible risk factors include:   Smoking more than 15 cigarettes per day.  History of infertility.  Having a certain blood type (A, B, AB).  Having a vitamin A deficiency.  Using oral contraceptives. SIGNS AND SYMPTOMS   Vaginal bleeding.  Missed menstrual period.  Uterus grows quicker than normal.  Severe nausea and vomiting.  Severe pressure or pain in the uterus.  Abnormal ovarian cysts (theca lutein cysts).  Discharge from the vagina that looks like grapes.  High blood pressure (early onset of preeclampsia).  Overactive thyroid (hyperthyroidism).  Anemia. DIAGNOSIS  If your health care provider thinks there is a chance of a molar pregnancy, testing will be recommended. Possible tests include:   An ultrasound test.  Blood tests. TREATMENT  Most molar pregnancies end on their own by miscarriage. However, a health care provider needs to make sure that all the abnormal tissue is out of the  womb. This can be done with dilation and curettage (D&C) or suction curettage. In this procedure, any remaining molar tissue is removed through the vagina. After diagnosis of a molar pregnancy, the pregnancy hormone levels must be followed until the level is zero. If the pregnancy hormone level does not drop appropriately, chemotherapy may be necessary. Also, you will be given a medicine called Rho(D) immune globulin if you are Rh negative and your sex partner is Rh positive. This helps prevent Rh problems in future pregnancies. HOME CARE INSTRUCTIONS   Avoid getting pregnant for 6 12 months or as directed by your health care provider. Use a reliable form of birth control or do not have sex.  Only take over-the-counter or prescription medicine as directed by your health care provider.  Keep all follow-up appointments and get all suggested lab tests and ultrasound tests.  Gradually return to normal activities.  Think about joining a support group. Ask for help if you are struggling with grief. Document Released: 08/09/2011 Document Revised: 09/11/2013 Document Reviewed: 06/20/2013 [facility]Exit[facility] Patient Information 2014 [place]ExitCare, [place]LL[place]. Follow up in 5 days for pre op

## 2014-01-29 NOTE — Progress Notes (Addendum)
Subjective:     Patient ID: Megan Hardin, female   DOB: October 07, 1981, [age] y.o.   MRN: [id number]  HPI Megan Hardin is a 33 year old white female, in complaining of bleeding for 2 weeks now, was diagnosed with miscarriage several weeks ago.Had pain right side Monday.blood type 0+.  Review of Systems See HPI Reviewed past medical,surgical, social and family history. Reviewed medications and allergies.     Objective:   Physical Exam BP 140/64  Ht 5' 8.5" (1.74 m)  Wt 357 lb 12.8 oz (162.297 kg)  BMI 53.61 kg/m2  LMP 01/11/2014   UPT lightly positive, Skin warm and dry.Pelvic: external genitalia is normal in appearance, vagina: scant discharge without odor, cervix:smoot, uterus: normal size, shape and contour, non tender, no masses felt, adnexa: no masses or tenderness noted, difficult secondary to abdominal girth.US showed complex endometrium, with cystic appearance and with blood flow.discussed with Dr Despina HiddenEure, could be molar pregnancy by look of endometrium.discussed with pt could be molar pregnancy or just abnormal tissue collection.   Assessment:     Miscarriage    Plan:     Return in 5 days for pre op with Dr Despina HiddenEure, [facility] Check QHCG today   Review handout on molar pregnancy

## 2014-01-30 ENCOUNTER — Encounter (HOSPITAL_COMMUNITY): Payer: Self-pay | Admitting: Pharmacy Technician

## 2014-01-30 LAB — HCG, QUANTITATIVE, PREGNANCY: hCG, Beta Chain, Quant, S: 113.9 m[IU]/mL

## 2014-01-31 ENCOUNTER — Telehealth: Payer: Self-pay | Admitting: Adult Health

## 2014-01-31 ENCOUNTER — Encounter (HOSPITAL_COMMUNITY): Payer: Self-pay

## 2014-01-31 ENCOUNTER — Encounter (HOSPITAL_COMMUNITY)
Admission: RE | Admit: 2014-01-31 | Discharge: 2014-01-31 | Disposition: A | Discharge status: Home / Self Care | Payer: Medicaid Other | Source: Ambulatory Visit | Attending: Obstetrics & Gynecology | Admitting: Obstetrics & Gynecology

## 2014-01-31 DIAGNOSIS — Z01812 Encounter for preprocedural laboratory examination: Secondary | ICD-10-CM | POA: Insufficient documentation

## 2014-01-31 LAB — BASIC METABOLIC PANEL
BUN: 11 mg/dL (ref 6–23)
CO2: 28 mEq/L (ref 19–32)
Calcium: 9.1 mg/dL (ref 8.4–10.5)
Chloride: 103 mEq/L (ref 96–112)
Creatinine, Ser: 0.75 mg/dL (ref 0.50–1.10)
GLUCOSE: 135 mg/dL — AB (ref 70–99)
Potassium: 4.7 mEq/L (ref 3.7–5.3)
Sodium: 142 mEq/L (ref 137–147)

## 2014-01-31 LAB — HEMOGLOBIN AND HEMATOCRIT, BLOOD
HCT: 38.4 % (ref 36.0–46.0)
Hemoglobin: 12.7 g/dL (ref 12.0–15.0)

## 2014-01-31 NOTE — Patient Instructions (Signed)
Megan Hardin  01/31/2014   Your procedure is scheduled on: 02/05/14  [facility] at 0[facility] AM.  Call this number if you have problems the morning of surgery: [phone]   Remember:   Do not eat food or drink liquids after midnight.   Take these medicines the morning of surgery with A SIP OF WATER: none   Do not wear jewelry, make-up or nail polish.  Do not wear lotions, powders, or perfumes. You may wear deodorant.  Do not shave 48 hours prior to surgery. Men may shave face and neck.  Do not bring valuables to the hospital.  [facility]                  for any belongings or valuables.               Contacts, dentures or bridgework may not be worn into surgery.  Leave suitcase in the car. After surgery it may be brought to your room.  For patients admitted to the hospital, discharge time is determined by your                treatment team.               Patients discharged the day of surgery will not be allowed to drive  home.  Name and phone number of your driver: family  Special Instructions: Shower using CHG 2 nights before surgery and the night before surgery.  If you shower the day of surgery use CHG.  Use special wash - you have one bottle of CHG for all showers.  You should use approximately 1/3 of the bottle for each shower.   Please read over the following fact sheets that you were given: Pain Booklet, Anesthesia Post-op Instructions and Care and Recovery After Surgery    How to Use Chlorhexidine Before Surgery Chlorhexidine is a germ-killing(antiseptic) solution that is used to clean the skin. It gets rid of the bacteria that usually live on the skin. Because your skin needs to be very clean before you have surgery, your caregiver may give you chlorhexidine to wash with before the surgery. You may be given chlorhexidine to use at home. For example, you may be told to wash with chlorhexidine the night before and the morning of your surgery. Washing  with chlorhexidine before surgery helps prevent infections from developing after the surgery.  HOW TO USE CHLORHEXIDINE  Only use chlorhexidine as directed by your caregiver. Do not use more chlorhexidine than you are told to, and do not use it more often than you are told. Make sure you follow the instructions on the label. If you have any questions, call your caregiver. You can use chlorhexidine while taking a shower or a bath. Unless instructed otherwise, follow these steps when using chlorhexidine: 1. Wash your face and your hair using the soap and shampoo that you normally use. 2. Turn off the shower or stand up in the tub. Then, wash with chlorhexidine. To wash with chlorhexidine:  If you were given a special sponge, use that. If you were not given a sponge, use only a cloth. Do not use any sort of brush. If the sponge has a brush on one side, do not use that side.  Start at your neck and scrub down to your toes. Avoid your genital area. Chlorhexidine can cause a skin reaction. Also, avoid any areas of skin that have cuts or scrapes.  Pay special  attention to the part of your body where you will be having surgery. Scrub this area for at least 1 minute.  Be sure to get your back and under your arms.  Do not use chlorhexidine on your head. If the solution gets into your ears or eyes, rinse them well with water. Chlorhexidine can cause hearing lossif the solution gets into your ear. It can also harm your eye if it gets in your eye and is not rinsed out. 3. Rinse your body completely in the shower or tub. Be sure that all body creases and crevices are rinsed well. 4. Dry off with a clean towel. Do not put any substances on your body afterward, such as powder, lotion, or perfume. 5. Put on clean clothing. 6. If it is the night before your surgery, sleep in clean sheets. SEEK MEDICAL CARE IF:   Your skin gets irritated after scrubbing. SEEK IMMEDIATE MEDICAL CARE IF:   Your eyes become very  red or swollen.  Your eyes itch badly.  Your skin itches badly and is red or swollen.  Your hearing changes.  You have trouble seeing.  You have trouble breathing.  You swallow any chlorhexidine. Document Released: 08/15/2012 Document Revised: 11/07/2012 Document Reviewed: 08/15/2012 [facility] Patient Information 2014 [place], [place]. PATIENT INSTRUCTIONS POST-ANESTHESIA  IMMEDIATELY FOLLOWING SURGERY:  Do not drive or operate machinery for the first twenty four hours after surgery.  Do not make any important decisions for twenty four hours after surgery or while taking narcotic pain medications or sedatives.  If you develop intractable nausea and vomiting or a severe headache please notify your doctor immediately.  FOLLOW-UP:  Please make an appointment with your surgeon as instructed. You do not need to follow up with anesthesia unless specifically instructed to do so.  WOUND CARE INSTRUCTIONS (if applicable):  Keep a dry clean dressing on the anesthesia/puncture wound site if there is drainage.  Once the wound has quit draining you may leave it open to air.  Generally you should leave the bandage intact for twenty four hours unless there is drainage.  If the epidural site drains for more than 36-48 hours please call the anesthesia department.  QUESTIONS?:  Please feel free to call your physician or the hospital operator if you have any questions, and they will be happy to assist you.      Dilation and Curettage or Vacuum Curettage Dilation and curettage (D&C) and vacuum curettage are minor procedures. A D&C involves stretching (dilation) the cervix and scraping (curettage) the inside lining of the womb (uterus). During a D&C, tissue is gently scraped from the inside lining of the uterus. During a vacuum curettage, the lining and tissue in the uterus are removed with the use of gentle suction.  Curettage may be performed to either diagnose or treat a problem. As a diagnostic procedure,  curettage is performed to examine tissues from the uterus. A diagnostic curettage may be performed for the following symptoms:   Irregular bleeding in the uterus.   Bleeding with the development of clots.   Spotting between menstrual periods.   Prolonged menstrual periods.   Bleeding after menopause.   No menstrual period (amenorrhea).   A change in size and shape of the uterus.  As a treatment procedure, curettage may be performed for the following reasons:   Removal of an IUD (intrauterine device).   Removal of retained placenta after giving birth. Retained placenta can cause an infection or bleeding severe enough to require transfusions.  Abortion.   Miscarriage.   Removal of polyps inside the uterus.   Removal of uncommon types of noncancerous lumps (fibroids).  LET [facility] CARE PROVIDER KNOW ABOUT:   Any allergies you have.   All medicines you are taking, including vitamins, herbs, eye drops, creams, and over-the-counter medicines.   Previous problems you or members of your family have had with the use of anesthetics.   Any blood disorders you have.   Previous surgeries you have had.   Medical conditions you have. RISKS AND COMPLICATIONS  Generally, this is a safe procedure. However, as with any procedure, complications can occur. Possible complications include:  Excessive bleeding.   Infection of the uterus.   Damage to the cervix.   Development of scar tissue (adhesions) inside the uterus, later causing abnormal amounts of menstrual bleeding.   Complications from the general anesthetic, if a general anesthetic is used.   Putting a hole (perforation) in the uterus. This is rare.  BEFORE THE PROCEDURE   Eat and drink before the procedure only as directed by your health care provider.   Arrange for someone to take you home.  PROCEDURE  This procedure usually takes about 15 30 minutes.  You will be given one of the  following:  A medicine that numbs the area in and around the cervix (local anesthetic).   A medicine to make you sleep through the procedure (general anesthetic).  You will lie on your back with your legs in stirrups.   A warm metal or plastic instrument (speculum) will be placed in your vagina to keep it open and to allow the health care provider to see the cervix.  There are two ways in which your cervix can be softened and dilated. These include:   Taking a medicine.   Having thin rods (laminaria) inserted into your cervix.   A curved tool (curette) will be used to scrape cells from the inside lining of the uterus. In some cases, gentle suction is applied with the curette. The curette will then be removed.  AFTER THE PROCEDURE   You will rest in the recovery area until you are stable and are ready to go home.   You may feel sick to your stomach (nauseous) or throw up (vomit) if you were given a general anesthetic.   You may have a sore throat if a tube was placed in your throat during general anesthesia.   You may have light cramping and bleeding. This may last for 2 days to 2 weeks after the procedure.   Your uterus needs to make a new lining after the procedure. This may make your next period late. Document Released: 11/21/2005 Document Revised: 07/24/2013 Document Reviewed: 06/20/2013 [facility] Patient Information 2014 [place], [place].

## 2014-01-31 NOTE — Telephone Encounter (Signed)
Pt aware of labs  

## 2014-02-03 ENCOUNTER — Encounter: Payer: Self-pay | Admitting: Obstetrics & Gynecology

## 2014-02-03 ENCOUNTER — Ambulatory Visit (INDEPENDENT_AMBULATORY_CARE_PROVIDER_SITE_OTHER): Payer: Medicaid Other | Admitting: Obstetrics & Gynecology

## 2014-02-03 VITALS — BP 140/80 | Wt 356.0 lb

## 2014-02-03 DIAGNOSIS — O021 Missed abortion: Secondary | ICD-10-CM

## 2014-02-03 NOTE — Progress Notes (Signed)
Patient ID: Megan Hardin, female   DOB: [birth year], 33 y.o.   MRN: [id number] Pt with first trimester pregnancy loss sonogram somewhat suggestive of a molar pregnancy [facility]  Past Medical History  Diagnosis Date  . PCOS (polycystic ovarian syndrome)   . Miscarriage   . Obesity     Past Surgical History  Procedure Laterality Date  . Eye surgery Left     OB History   Grav Para Term Preterm Abortions TAB SAB Ect Mult Living   1    1  1          Allergies  Allergen Reactions  . Dilaudid [Hydromorphone Hcl] Other (See Comments)    Panic attacks  . Doxycycline Hives    History   Social History  . Marital Status: Married    Spouse Name: N/A    Number of Children: N/A  . Years of Education: N/A   Social History Main Topics  . Smoking status: Former Smoker    Types: Cigarettes  . Smokeless tobacco: Never Used  . Alcohol Use: Yes     Comment: socially  . Drug Use: No  . Sexual Activity: Yes   Other Topics Concern  . None   Social History Narrative  . None    Family History  Problem Relation Age of Onset  . Diabetes Mother   . Hypertension Father   . Diabetes Maternal Grandmother   . Diabetes Maternal Grandfather

## 2014-02-05 ENCOUNTER — Ambulatory Visit (HOSPITAL_COMMUNITY)
Admission: RE | Admit: 2014-02-05 | Discharge: 2014-02-05 | Disposition: A | Discharge status: Home / Self Care | Payer: Medicaid Other | Source: Ambulatory Visit | Attending: Obstetrics & Gynecology | Admitting: Obstetrics & Gynecology

## 2014-02-05 ENCOUNTER — Encounter (HOSPITAL_COMMUNITY)
Admission: RE | Disposition: A | Discharge status: Home / Self Care | Payer: Self-pay | Source: Ambulatory Visit | Attending: Obstetrics & Gynecology

## 2014-02-05 ENCOUNTER — Encounter (HOSPITAL_COMMUNITY): Payer: Self-pay

## 2014-02-05 ENCOUNTER — Encounter (HOSPITAL_COMMUNITY): Payer: Medicaid Other | Admitting: Anesthesiology

## 2014-02-05 ENCOUNTER — Ambulatory Visit (HOSPITAL_COMMUNITY): Payer: Medicaid Other | Admitting: Anesthesiology

## 2014-02-05 DIAGNOSIS — O021 Missed abortion: Secondary | ICD-10-CM

## 2014-02-05 DIAGNOSIS — E669 Obesity, unspecified: Secondary | ICD-10-CM | POA: Insufficient documentation

## 2014-02-05 DIAGNOSIS — O039 Complete or unspecified spontaneous abortion without complication: Secondary | ICD-10-CM

## 2014-02-05 DIAGNOSIS — Z87891 Personal history of nicotine dependence: Secondary | ICD-10-CM | POA: Insufficient documentation

## 2014-02-05 HISTORY — PX: DILATION AND CURETTAGE OF UTERUS: SHX78

## 2014-02-05 SURGERY — DILATION AND CURETTAGE
Anesthesia: General | Site: Uterus

## 2014-02-05 MED ORDER — PROPOFOL 10 MG/ML IV BOLUS
INTRAVENOUS | Status: DC | PRN
  Administered 2014-02-05: 200 mg via INTRAVENOUS
  Administered 2014-02-05: 100 mg via INTRAVENOUS
  Administered 2014-02-05: 200 mg via INTRAVENOUS

## 2014-02-05 MED ORDER — GLYCOPYRROLATE 0.2 MG/ML IJ SOLN
0.2000 mg | Freq: Once | INTRAMUSCULAR | Status: AC
  Administered 2014-02-05: 0.2 mg via INTRAVENOUS

## 2014-02-05 MED ORDER — METHYLERGONOVINE MALEATE 0.2 MG/ML IJ SOLN
INTRAMUSCULAR | Status: AC
  Filled 2014-02-05: qty 1

## 2014-02-05 MED ORDER — KETOROLAC TROMETHAMINE 10 MG PO TABS: 10.0000 mg | ORAL_TABLET | Freq: Three times a day (TID) | ORAL | Status: DC | PRN

## 2014-02-05 MED ORDER — KETOROLAC TROMETHAMINE 30 MG/ML IJ SOLN
30.0000 mg | Freq: Once | INTRAMUSCULAR | Status: AC
  Administered 2014-02-05: 30 mg via INTRAVENOUS

## 2014-02-05 MED ORDER — 0.9 % SODIUM CHLORIDE (POUR BTL) OPTIME
TOPICAL | Status: DC | PRN
  Administered 2014-02-05: 1000 mL

## 2014-02-05 MED ORDER — LACTATED RINGERS IV SOLN
INTRAVENOUS | Status: DC
  Administered 2014-02-05: 09:00:00 via INTRAVENOUS

## 2014-02-05 MED ORDER — PROPOFOL 10 MG/ML IV BOLUS
INTRAVENOUS | Status: AC
  Filled 2014-02-05: qty 20

## 2014-02-05 MED ORDER — ONDANSETRON HCL 4 MG/2ML IJ SOLN
INTRAMUSCULAR | Status: AC
  Filled 2014-02-05: qty 2

## 2014-02-05 MED ORDER — DEXTROSE 5 % IV SOLN: 3.0000 g | INTRAVENOUS | Status: DC

## 2014-02-05 MED ORDER — METHYLERGONOVINE MALEATE 0.2 MG PO TABS: ORAL_TABLET | ORAL | Status: DC

## 2014-02-05 MED ORDER — CEFAZOLIN SODIUM-DEXTROSE 2-3 GM-% IV SOLR
INTRAVENOUS | Status: AC
  Filled 2014-02-05: qty 50

## 2014-02-05 MED ORDER — FENTANYL CITRATE 0.05 MG/ML IJ SOLN
25.0000 ug | INTRAMUSCULAR | Status: DC | PRN
  Administered 2014-02-05 (×2): 50 ug via INTRAVENOUS
  Administered 2014-02-05: 25 ug via INTRAVENOUS
  Filled 2014-02-05: qty 2

## 2014-02-05 MED ORDER — FENTANYL CITRATE 0.05 MG/ML IJ SOLN
INTRAMUSCULAR | Status: AC
  Filled 2014-02-05: qty 2

## 2014-02-05 MED ORDER — CEFAZOLIN SODIUM 1-5 GM-% IV SOLN: 1.0000 g | Freq: Once | INTRAVENOUS | Status: DC

## 2014-02-05 MED ORDER — ONDANSETRON HCL 4 MG/2ML IJ SOLN
4.0000 mg | Freq: Once | INTRAMUSCULAR | Status: AC | PRN
  Administered 2014-02-05: 4 mg via INTRAVENOUS

## 2014-02-05 MED ORDER — FENTANYL CITRATE 0.05 MG/ML IJ SOLN
INTRAMUSCULAR | Status: DC | PRN
  Administered 2014-02-05: 25 ug via INTRAVENOUS
  Administered 2014-02-05: 50 ug via INTRAVENOUS
  Administered 2014-02-05: 25 ug via INTRAVENOUS
  Administered 2014-02-05 (×2): 50 ug via INTRAVENOUS
  Administered 2014-02-05 (×2): 25 ug via INTRAVENOUS

## 2014-02-05 MED ORDER — ONDANSETRON HCL 4 MG/2ML IJ SOLN
4.0000 mg | Freq: Once | INTRAMUSCULAR | Status: AC
  Administered 2014-02-05: 4 mg via INTRAVENOUS

## 2014-02-05 MED ORDER — METHYLERGONOVINE MALEATE 0.2 MG/ML IJ SOLN
INTRAMUSCULAR | Status: DC | PRN
  Administered 2014-02-05: 0.2 mg via INTRAMUSCULAR

## 2014-02-05 MED ORDER — KETOROLAC TROMETHAMINE 30 MG/ML IJ SOLN
INTRAMUSCULAR | Status: AC
  Filled 2014-02-05: qty 1

## 2014-02-05 MED ORDER — MIDAZOLAM HCL 2 MG/2ML IJ SOLN
1.0000 mg | INTRAMUSCULAR | Status: AC | PRN
  Administered 2014-02-05 (×3): 2 mg via INTRAVENOUS
  Filled 2014-02-05 (×2): qty 2

## 2014-02-05 MED ORDER — HYDROCODONE-ACETAMINOPHEN 5-325 MG PO TABS: 1.0000 | ORAL_TABLET | Freq: Four times a day (QID) | ORAL | Status: DC | PRN

## 2014-02-05 MED ORDER — SUCCINYLCHOLINE CHLORIDE 20 MG/ML IJ SOLN
INTRAMUSCULAR | Status: AC
  Filled 2014-02-05: qty 1

## 2014-02-05 MED ORDER — MIDAZOLAM HCL 2 MG/2ML IJ SOLN
INTRAMUSCULAR | Status: AC
  Filled 2014-02-05: qty 2

## 2014-02-05 MED ORDER — CEFAZOLIN SODIUM-DEXTROSE 2-3 GM-% IV SOLR
2.0000 g | Freq: Once | INTRAVENOUS | Status: AC
  Administered 2014-02-05: 3 g via INTRAVENOUS

## 2014-02-05 MED ORDER — LIDOCAINE HCL 1 % IJ SOLN
INTRAMUSCULAR | Status: DC | PRN
  Administered 2014-02-05: 40 mg via INTRADERMAL

## 2014-02-05 MED ORDER — LIDOCAINE HCL (PF) 1 % IJ SOLN
INTRAMUSCULAR | Status: AC
  Filled 2014-02-05: qty 5

## 2014-02-05 MED ORDER — CEFAZOLIN SODIUM 1-5 GM-% IV SOLN
INTRAVENOUS | Status: AC
  Filled 2014-02-05: qty 50

## 2014-02-05 MED ORDER — GLYCOPYRROLATE 0.2 MG/ML IJ SOLN
INTRAMUSCULAR | Status: AC
Start: 2014-02-05 — End: 2014-02-05
  Filled 2014-02-05: qty 1

## 2014-02-05 MED ORDER — FENTANYL CITRATE 0.05 MG/ML IJ SOLN
25.0000 ug | INTRAMUSCULAR | Status: AC
  Administered 2014-02-05 (×2): 25 ug via INTRAVENOUS

## 2014-02-05 MED ORDER — FENTANYL CITRATE 0.05 MG/ML IJ SOLN
INTRAMUSCULAR | Status: AC
  Filled 2014-02-05: qty 5

## 2014-02-05 SURGICAL SUPPLY — 28 items
BAG HAMPER (MISCELLANEOUS) ×3 IMPLANT
CLOTH BEACON ORANGE TIMEOUT ST (SAFETY) ×3 IMPLANT
COVER LIGHT HANDLE STERIS (MISCELLANEOUS) ×6 IMPLANT
CURETTE VACUUM 9MM CVD CLR (CANNULA) ×3 IMPLANT
FORMALIN 10 PREFIL 480ML (MISCELLANEOUS) ×3 IMPLANT
GAUZE SPONGE 4X4 16PLY XRAY LF (GAUZE/BANDAGES/DRESSINGS) ×3 IMPLANT
GLOVE BIOGEL PI IND STRL 6.5 (GLOVE) ×1 IMPLANT
GLOVE BIOGEL PI IND STRL 7.0 (GLOVE) ×1 IMPLANT
GLOVE BIOGEL PI IND STRL 8 (GLOVE) ×1 IMPLANT
GLOVE BIOGEL PI INDICATOR 6.5 (GLOVE) ×2
GLOVE BIOGEL PI INDICATOR 7.0 (GLOVE) ×2
GLOVE BIOGEL PI INDICATOR 8 (GLOVE) ×2
GLOVE ECLIPSE 6.5 STRL STRAW (GLOVE) ×3 IMPLANT
GLOVE ECLIPSE 8.0 STRL XLNG CF (GLOVE) ×3 IMPLANT
GOWN STRL REUS W/TWL LRG LVL3 (GOWN DISPOSABLE) ×6 IMPLANT
GOWN STRL REUS W/TWL XL LVL3 (GOWN DISPOSABLE) ×3 IMPLANT
KIT BERKELEY 1ST TRIMESTER 3/8 (MISCELLANEOUS) ×3 IMPLANT
KIT ROOM TURNOVER AP CYSTO (KITS) ×3 IMPLANT
MANIFOLD NEPTUNE II (INSTRUMENTS) ×3 IMPLANT
MARKER SKIN DUAL TIP RULER LAB (MISCELLANEOUS) ×3 IMPLANT
NS IRRIG 1000ML POUR BTL (IV SOLUTION) ×6 IMPLANT
PACK BASIC III (CUSTOM PROCEDURE TRAY) ×3
PACK SRG BSC III STRL LF ECLPS (CUSTOM PROCEDURE TRAY) ×1 IMPLANT
PAD ARMBOARD 7.5X6 YLW CONV (MISCELLANEOUS) ×3 IMPLANT
SET BERKELEY SUCTION TUBING (SUCTIONS) ×3 IMPLANT
TOWEL OR 17X26 4PK STRL BLUE (TOWEL DISPOSABLE) ×3 IMPLANT
VACURETTE 10MM (CANNULA) ×3 IMPLANT
VACURETTE 8MM (CANNULA) ×3 IMPLANT

## 2014-02-05 NOTE — Op Note (Signed)
Preoperative diagnosis: missed abortion in the first trimester  Postoperative diagnosis:  Same as above  Procedure:  Cervical dilation with suction and sharp uterine curettage  Surgeon:  Lazaro ArmsEURE,Torrin Crihfield H  Anesthesia:  Laryngeal mask airway  Findings:  Patient known to have a non viable pregnancy diagnosed by serial sonograms.  Somewhat concerning for molar pregnancy, hence the surgical approach  Description of operation:  The patient was taken to the operating room and placed in the supine position.  She underwent laryngeal mask airway general anesthesia.  The patient was placed in the dorsal lithotomy position.  The vagina was prepped and draped in the usual sterile fashion.  A Graves speculum was placed.  The anterior cervix was grasped with a single-tooth tenaculum.  The cervix was dilated serially with Hegar dilators.  A #8 curved suction curette was placed in the uterus.  The suction pressure was placed at 55 and several passes were made.  All of the intrauterine contents were removed.  The sharp curette was used x1 to feel uterine crie in all areas.  The patient was given Methergine 0.2 mg IM x1.  There was good hemostasis.  The patient was given Ancef 3 grams IV preoperatively.  The patient was given Toradol 30 mg IV preoperatively.  Estimated blood loss for the procedure was minimal cc.  The patient was awakened from anesthesia taken to the recovery room in good stable condition.  All counts were correct x3.  Hassen Bruun H 02/05/2014 11:31 AM

## 2014-02-05 NOTE — Anesthesia Procedure Notes (Signed)
Procedure Name: LMA Insertion Date/Time: 02/05/2014 11:02 AM Performed by: Glynn OctaveANIEL, Quentina Fronek E Pre-anesthesia Checklist: Patient identified, Patient being monitored, Emergency Drugs available, Timeout performed and Suction available Patient Re-evaluated:Patient Re-evaluated prior to inductionOxygen Delivery Method: Circle System Utilized Preoxygenation: Pre-oxygenation with 100% oxygen Intubation Type: IV induction Ventilation: Mask ventilation without difficulty LMA: LMA inserted LMA Size: 4.0 Number of attempts: 1 Placement Confirmation: positive ETCO2 and breath sounds checked- equal and bilateral Comments: Note propofol 500mg , pt never stopped breathing.

## 2014-02-05 NOTE — H&P (Signed)
Preoperative History and Physical  Megan Hardin is a 33 y.o. G1P0010  admitted for a suction and sharp curettage for a missed ab but the sonogram is somewhat concerning for a molar pregnancy.  Surgical management to evaluate the pathology  PMH:    Past Medical History  Diagnosis Date  . PCOS (polycystic ovarian syndrome)   . Miscarriage   . Obesity     PSH:     Past Surgical History  Procedure Laterality Date  . Eye surgery Left     POb/GynH:      OB History   Grav Para Term Preterm Abortions TAB SAB Ect Mult Living   1    1  1          SH:   History  Substance Use Topics  . Smoking status: Former Smoker    Types: Cigarettes  . Smokeless tobacco: Never Used  . Alcohol Use: Yes     Comment: socially    FH:    Family History  Problem Relation Age of Onset  . Diabetes Mother   . Hypertension Father   . Diabetes Maternal Grandmother   . Diabetes Maternal Grandfather      Allergies:  Allergies  Allergen Reactions  . Dilaudid [Hydromorphone Hcl] Other (See Comments)    Panic attacks  . Doxycycline Hives    Medications:      Current facility-administered medications:ceFAZolin (ANCEF) IVPB 1 g/50 mL premix, 1 g, Intravenous, Once, Lazaro ArmsLuther H Ilka Lovick, MD;  ceFAZolin (ANCEF) IVPB 2 g/50 mL premix, 2 g, Intravenous, Once, Lazaro ArmsLuther H Liel Rudden, MD;  lactated ringers infusion, , Intravenous, Continuous, Laurene FootmanLuis Gonzalez, MD, Last Rate: 75 mL/hr at 02/05/14 0831  Review of Systems:   Review of Systems  Constitutional: Negative for fever, chills, weight loss, malaise/fatigue and diaphoresis.  HENT: Negative for hearing loss, ear pain, nosebleeds, congestion, sore throat, neck pain, tinnitus and ear discharge.   Eyes: Negative for blurred vision, double vision, photophobia, pain, discharge and redness.  Respiratory: Negative for cough, hemoptysis, sputum production, shortness of breath, wheezing and stridor.   Cardiovascular: Negative for chest pain, palpitations, orthopnea,  claudication, leg swelling and PND.  Gastrointestinal: Positive for abdominal pain. Negative for heartburn, nausea, vomiting, diarrhea, constipation, blood in stool and melena.  Genitourinary: Negative for dysuria, urgency, frequency, hematuria and flank pain.  Musculoskeletal: Negative for myalgias, back pain, joint pain and falls.  Skin: Negative for itching and rash.  Neurological: Negative for dizziness, tingling, tremors, sensory change, speech change, focal weakness, seizures, loss of consciousness, weakness and headaches.  Endo/Heme/Allergies: Negative for environmental allergies and polydipsia. Does not bruise/bleed easily.  Psychiatric/Behavioral: Negative for depression, suicidal ideas, hallucinations, memory loss and substance abuse. The patient is not nervous/anxious and does not have insomnia.      PHYSICAL EXAM:  Blood pressure 121/56, pulse 95, temperature 97.8 F (36.6 C), temperature source Oral, resp. rate 24, last menstrual period 01/11/2014, SpO2 96.00%.    Vitals reviewed. Constitutional: She is oriented to person, place, and time. She appears well-developed and well-nourished.  HENT:  Head: Normocephalic and atraumatic.  Right Ear: External ear normal.  Left Ear: External ear normal.  Nose: Nose normal.  Mouth/Throat: Oropharynx is clear and moist.  Eyes: Conjunctivae and EOM are normal. Pupils are equal, round, and reactive to light. Right eye exhibits no discharge. Left eye exhibits no discharge. No scleral icterus.  Neck: Normal range of motion. Neck supple. No tracheal deviation present. No thyromegaly present.  Cardiovascular: Normal rate, regular rhythm, normal  heart sounds and intact distal pulses.  Exam reveals no gallop and no friction rub.   No murmur heard. Respiratory: Effort normal and breath sounds normal. No respiratory distress. She has no wheezes. She has no rales. She exhibits no tenderness.  GI: Soft. Bowel sounds are normal. She exhibits no  distension and no mass. There is tenderness. There is no rebound and no guarding.  Genitourinary:       Vulva is normal without lesions Vagina is pink moist without discharge Cervix normal in appearance and pap is normal Uterus is significant for a non viable pregnancy with sonographic concern for a molar pregnancy Adnexa is negative with normal sized ovaries by sonogram  Musculoskeletal: Normal range of motion. She exhibits no edema and no tenderness.  Neurological: She is alert and oriented to person, place, and time. She has normal reflexes. She displays normal reflexes. No cranial nerve deficit. She exhibits normal muscle tone. Coordination normal.  Skin: Skin is warm and dry. No rash noted. No erythema. No pallor.  Psychiatric: She has a normal mood and affect. Her behavior is normal. Judgment and thought content normal.    Labs: Results for orders placed during the hospital encounter of 01/31/14 (from the past 336 hour(s))  HEMOGLOBIN AND HEMATOCRIT, BLOOD   Collection Time    01/31/14 10:54 AM      Result Value Ref Range   Hemoglobin 12.7  12.0 - 15.0 g/dL   HCT 16.1  09.6 - 04.5 %  BASIC METABOLIC PANEL   Collection Time    01/31/14 10:54 AM      Result Value Ref Range   Sodium 142  137 - 147 mEq/L   Potassium 4.7  3.7 - 5.3 mEq/L   Chloride 103  96 - 112 mEq/L   CO2 28  19 - 32 mEq/L   Glucose, Bld 135 (*) 70 - 99 mg/dL   BUN 11  6 - 23 mg/dL   Creatinine, Ser 4.09  0.50 - 1.10 mg/dL   Calcium 9.1  8.4 - 81.1 mg/dL   GFR calc non Af Amer >90  >90 mL/min   GFR calc Af Amer >90  >90 mL/min  Results for orders placed in visit on 01/29/14 (from the past 336 hour(s))  POCT URINE PREGNANCY   Collection Time    01/29/14  2:29 PM      Result Value Ref Range   Preg Test, Ur Positive    HCG, QUANTITATIVE, PREGNANCY   Collection Time    01/29/14  3:15 PM      Result Value Ref Range   hCG, Beta Chain, Quant, S 113.9    Results for orders placed in visit on 11/20/13 (from  the past 336 hour(s))  POCT URINE PREGNANCY   Collection Time    01/29/14  2:29 PM      Result Value Ref Range   Preg Test, Ur Positive      EKG: No orders found for this or any previous visit.  Imaging Studies: US Ob Transvaginal  01/29/2014   DATING AND VIABILITY SONOGRAM   Megan Hardin is a 32 y.o. year old G1P0010 with lightly +UPT today @  office with pain and continued bleeding.  CERVIX: Appears long and closed   ADNEXA: The ovaries are normal.  Uterus                      7.8 x 4.4 x 3.5 cm,   Endometrium  24.4 x 21 mm, complex with +Doppler flow noted within  Right ovary             2.4 x 1.6 cm,   Left ovary                3.0 x 1.9cm,   No free fluid or adnexal masses noted within pelvis   TECHNICIAN COMMENTS:  Anteverted uterus with endometrial cavity= 24 x 21mm heterogeneous with  cystic areas within +Doppler flow noted within, bilateral adnexa and  ovaries appears WNL       A copy of this report including all images has been saved and backed up to  a second source for retrieval if needed. All measures and details of the  anatomical scan, placentation, fluid volume and pelvic anatomy are  contained in that report.  Chari Manning 01/29/2014 2:32 PM       Assessment:  Missed Ab in the first trimester Sonographic concern for a molar pregnancy Patient Active Problem List   Diagnosis Date Noted  . Miscarriage 01/01/2014  . Threatened miscarriage 12/18/2013  . Anovulation 08/12/2013  . Leukocytosis, unspecified 08/09/2013  . Anemia 08/09/2013  . Bilateral cellulitis of lower leg 08/08/2013    Plan: Cervical dilation and uterine curettage, suction and sharp  Johnthan Axtman H 02/05/2014 9:54 AM

## 2014-02-05 NOTE — Anesthesia Postprocedure Evaluation (Signed)
  Anesthesia Post-op Note  Patient: Megan Hardin  Procedure(s) Performed: Procedure(s): CERVICAL DILATATION AND SUCTION/SHARP UTERINE EVACUATION (N/A)  Patient Location: PACU  Anesthesia Type:General  Level of Consciousness: awake, alert  and oriented  Airway and Oxygen Therapy: Patient Spontanous Breathing and Patient connected to face mask oxygen  Post-op Pain: mild  Post-op Assessment: Post-op Vital signs reviewed, Patient's Cardiovascular Status Stable, Respiratory Function Stable, Patent Airway and No signs of Nausea or vomiting  Post-op Vital Signs: Reviewed and stable  Complications: No apparent anesthesia complications

## 2014-02-05 NOTE — Discharge Instructions (Signed)
Incomplete Miscarriage °A miscarriage is the sudden loss of an unborn baby (fetus) before the 20th week of pregnancy. In an incomplete miscarriage, parts of the fetus or placenta (afterbirth) remain in the body.  °Having a miscarriage can be an emotional experience. Talk with your health care provider about any questions you may have about miscarrying, the grieving process, and your future pregnancy plans. °CAUSES  °· Problems with the fetal chromosomes that make it impossible for the baby to develop normally. Problems with the baby's genes or chromosomes are most often the result of errors that occur by chance as the embryo divides and grows. The problems are not inherited from the parents. °· Infection of the cervix or uterus. °· Hormone problems. °· Problems with the cervix, such as having an incompetent cervix. This is when the tissue in the cervix is not strong enough to hold the pregnancy. °· Problems with the uterus, such as an abnormally shaped uterus, uterine fibroids, or congenital abnormalities. °· Certain medical conditions. °· Smoking, drinking alcohol, or taking illegal drugs. °· Trauma. °SYMPTOMS  °· Vaginal bleeding or spotting, with or without cramps or pain. °· Pain or cramping in the abdomen or lower back. °· Passing fluid, tissue, or blood clots from the vagina. °DIAGNOSIS  °Your health care provider will perform a physical exam. You may also have an ultrasound to confirm the miscarriage. Blood or urine tests may also be ordered. °TREATMENT  °· Usually, a dilation and curettage (D&C) procedure is performed. During a D&C procedure, the cervix is widened (dilated) and any remaining fetal or placental tissue is gently removed from the uterus. °· Antibiotic medicines are prescribed if there is an infection. Other medicines may be given to reduce the size of the uterus (contract) if there is a lot of bleeding. °· If you have Rh negative blood and your baby was Rh positive, you will need an Rho(D)  immune globulin shot. This shot will protect any future baby from having Rh blood problems in future pregnancies. °· You may be confined to bed rest. This means you should stay in bed and only get up to use the bathroom. °HOME CARE INSTRUCTIONS  °· Rest as directed by your health care provider. °· Restrict activity as directed by your health care provider. You may be allowed to continue light activity if curettage was not done but you require further treatment. °· Keep track of the number of pads you use each day. Keep track of how soaked (saturated) they are. Record this information. °· Do not  use tampons. °· Do not douche or have sexual intercourse until approved by your health care provider. °· Keep all follow-up appointments for re-evaluation and continuing management. °· Only take over-the-counter or prescription medicines for pain, fever, or discomfort as directed by your health care provider. °· Take antibiotic medicine as directed by your health care provider. Make sure you finish it even if you start to feel better. °SEEK IMMEDIATE MEDICAL CARE IF:  °· You experience severe cramps in your stomach, back, or abdomen. °· You have an unexplained temperature (make sure to record these temperatures). °· You pass large clots or tissue (save these for your health care provider to inspect). °· Your bleeding increases. °· You become light-headed, weak, or have fainting episodes. °MAKE SURE YOU:  °· Understand these instructions. °· Will watch your condition. °· Will get help right away if you are not doing well or get worse. °Document Released: 11/21/2005 Document Revised: 09/11/2013 Document Reviewed: 06/20/2013 °  ExitCare® Patient Information ©2014 [employer]. ° °

## 2014-02-05 NOTE — Anesthesia Preprocedure Evaluation (Signed)
Anesthesia Evaluation  Patient identified by MRN, date of birth, ID band Patient awake    Reviewed: Allergy & Precautions, H&P , NPO status , Patient's Chart, lab work & pertinent test results  History of Anesthesia Complications Negative for: history of anesthetic complications  Airway Mallampati: II TM Distance: >3 FB Neck ROM: Full    Dental  (+) Teeth Intact   Pulmonary neg pulmonary ROS, former smoker,  breath sounds clear to auscultation        Cardiovascular negative cardio ROS  Rhythm:Regular Rate:Normal     Neuro/Psych    GI/Hepatic negative GI ROS,   Endo/Other  Morbid obesity  Renal/GU      Musculoskeletal   Abdominal   Peds  Hematology   Anesthesia Other Findings   Reproductive/Obstetrics                           Anesthesia Physical Anesthesia Plan  ASA: II  Anesthesia Plan: General   Post-op Pain Management:    Induction: Intravenous  Airway Management Planned: LMA  Additional Equipment:   Intra-op Plan:   Post-operative Plan: Extubation in OR  Informed Consent: I have reviewed the patients History and Physical, chart, labs and discussed the procedure including the risks, benefits and alternatives for the proposed anesthesia with the patient or authorized representative who has indicated his/her understanding and acceptance.     Plan Discussed with:   Anesthesia Plan Comments: (Possible GOT anesthesia discussed.)        Anesthesia Quick Evaluation

## 2014-02-05 NOTE — Transfer of Care (Signed)
Immediate Anesthesia Transfer of Care Note  Patient: Megan Hardin  Procedure(s) Performed: Procedure(s): CERVICAL DILATATION AND SUCTION/SHARP UTERINE EVACUATION (N/A)  Patient Location: PACU  Anesthesia Type:General  Level of Consciousness: awake, alert  and oriented  Airway & Oxygen Therapy: Patient Spontanous Breathing and Patient connected to face mask oxygen  Post-op Assessment: Report given to PACU RN  Post vital signs: Reviewed and stable  Complications: No apparent anesthesia complications

## 2014-02-06 ENCOUNTER — Encounter (HOSPITAL_COMMUNITY): Payer: Self-pay | Admitting: Obstetrics & Gynecology

## 2014-02-06 ENCOUNTER — Telehealth: Payer: Self-pay | Admitting: Obstetrics & Gynecology

## 2014-02-06 NOTE — Telephone Encounter (Signed)
Pt informed note left at front desk stating pt can return to work on February 09, 2014 with no restrictions per Dr.Eure.

## 2014-02-12 ENCOUNTER — Ambulatory Visit (INDEPENDENT_AMBULATORY_CARE_PROVIDER_SITE_OTHER): Payer: Self-pay | Admitting: Obstetrics & Gynecology

## 2014-02-12 ENCOUNTER — Encounter: Payer: Self-pay | Admitting: Obstetrics & Gynecology

## 2014-02-12 VITALS — BP 120/80 | Wt 357.0 lb

## 2014-02-12 DIAGNOSIS — Z9889 Other specified postprocedural states: Secondary | ICD-10-CM

## 2014-02-12 MED ORDER — CLOMIPHENE CITRATE 50 MG PO TABS: 50.0000 mg | ORAL_TABLET | Freq: Every day | ORAL | Status: DC

## 2014-02-12 MED ORDER — DESOGESTREL-ETHINYL ESTRADIOL 0.15-0.02/0.01 MG (21/5) PO TABS: 1.0000 | ORAL_TABLET | Freq: Every day | ORAL | Status: DC

## 2014-02-12 NOTE — Progress Notes (Signed)
Patient ID: Megan Hardin, female   DOB: 1981/08/05, 33 y.o.   MRN: [id number]  1 week post op D&C for missed ab, pathology normal Bleeding has subsided no other complaints  Exam  Normal non tender  Begin OCP x 3 cycles and then clomid 50 mg for ovulation induction  Past Medical History  Diagnosis Date  . PCOS (polycystic ovarian syndrome)   . Miscarriage   . Obesity     Past Surgical History  Procedure Laterality Date  . Eye surgery Left   . Dilation and curettage of uterus N/A 02/05/2014    Procedure: CERVICAL DILATATION AND SUCTION/SHARP UTERINE EVACUATION;  Surgeon: Lazaro ArmsLuther H Eure, MD;  Location: [facility];  Service: Gynecology;  Laterality: N/A;    OB History   Grav Para Term Preterm Abortions TAB SAB Ect Mult Living   1    1  1          Allergies  Allergen Reactions  . Dilaudid [Hydromorphone Hcl] Other (See Comments)    Panic attacks  . Doxycycline Hives    History   Social History  . Marital Status: Married    Spouse Name: N/A    Number of Children: N/A  . Years of Education: N/A   Social History Main Topics  . Smoking status: Former Smoker    Types: Cigarettes  . Smokeless tobacco: Never Used  . Alcohol Use: Yes     Comment: socially  . Drug Use: No  . Sexual Activity: Not Currently   Other Topics Concern  . None   Social History Narrative  . None    Family History  Problem Relation Age of Onset  . Diabetes Mother   . Hypertension Father   . Diabetes Maternal Grandmother   . Diabetes Maternal Grandfather

## 2014-04-02 ENCOUNTER — Ambulatory Visit: Payer: Medicaid Other | Admitting: Adult Health

## 2014-04-16 ENCOUNTER — Ambulatory Visit (INDEPENDENT_AMBULATORY_CARE_PROVIDER_SITE_OTHER): Payer: Medicaid Other | Admitting: Adult Health

## 2014-04-16 ENCOUNTER — Encounter: Payer: Self-pay | Admitting: Adult Health

## 2014-04-16 VITALS — BP 140/88 | Ht 68.5 in | Wt 364.8 lb

## 2014-04-16 DIAGNOSIS — Z3202 Encounter for pregnancy test, result negative: Secondary | ICD-10-CM

## 2014-04-16 LAB — POCT URINE PREGNANCY: Preg Test, Ur: NEGATIVE

## 2014-04-16 NOTE — Progress Notes (Signed)
Patient ID: Megan Hardin, female   DOB: 01-Dec-1981, 33 y.o.   MRN: [id number] Pt here for pregnancy test resulted negative, QHCG done today will follow up with pt with results.

## 2014-04-17 ENCOUNTER — Telehealth: Payer: Self-pay | Admitting: Adult Health

## 2014-04-17 LAB — HCG, QUANTITATIVE, PREGNANCY: HCG, BETA CHAIN, QUANT, S: 66.2 m[IU]/mL

## 2014-04-17 NOTE — Telephone Encounter (Signed)
Left message to call and also that [facility] and need to recheck in 48 hours

## 2014-04-18 ENCOUNTER — Other Ambulatory Visit: Payer: Medicaid Other

## 2014-04-18 DIAGNOSIS — Z3202 Encounter for pregnancy test, result negative: Secondary | ICD-10-CM

## 2014-04-19 LAB — HCG, QUANTITATIVE, PREGNANCY: HCG, BETA CHAIN, QUANT, S: 164.8 m[IU]/mL

## 2014-04-19 LAB — PROGESTERONE: Progesterone: 14.6 ng/mL

## 2014-04-21 ENCOUNTER — Telehealth: Payer: Self-pay | Admitting: Obstetrics & Gynecology

## 2014-04-21 NOTE — Telephone Encounter (Signed)
Pt informed of QHCG and progesterone levels from 04/18/2014, per Cyril MourningJennifer Griffin, NP to return in 10 days for u/s and to see her. Call transferred to front staff for an appt.

## 2014-04-29 ENCOUNTER — Other Ambulatory Visit: Payer: Self-pay | Admitting: Obstetrics & Gynecology

## 2014-04-29 DIAGNOSIS — O3680X0 Pregnancy with inconclusive fetal viability, not applicable or unspecified: Secondary | ICD-10-CM

## 2014-04-30 ENCOUNTER — Ambulatory Visit (INDEPENDENT_AMBULATORY_CARE_PROVIDER_SITE_OTHER): Payer: Medicaid Other

## 2014-04-30 ENCOUNTER — Ambulatory Visit (INDEPENDENT_AMBULATORY_CARE_PROVIDER_SITE_OTHER): Payer: Medicaid Other | Admitting: Adult Health

## 2014-04-30 ENCOUNTER — Other Ambulatory Visit: Payer: Self-pay | Admitting: Obstetrics & Gynecology

## 2014-04-30 ENCOUNTER — Encounter: Payer: Self-pay | Admitting: Adult Health

## 2014-04-30 VITALS — BP 144/90 | Ht 68.5 in | Wt 361.0 lb

## 2014-04-30 DIAGNOSIS — O09299 Supervision of pregnancy with other poor reproductive or obstetric history, unspecified trimester: Secondary | ICD-10-CM

## 2014-04-30 DIAGNOSIS — Z1389 Encounter for screening for other disorder: Secondary | ICD-10-CM

## 2014-04-30 DIAGNOSIS — Z3490 Encounter for supervision of normal pregnancy, unspecified, unspecified trimester: Secondary | ICD-10-CM

## 2014-04-30 DIAGNOSIS — Z331 Pregnant state, incidental: Secondary | ICD-10-CM | POA: Diagnosis not present

## 2014-04-30 DIAGNOSIS — O3680X0 Pregnancy with inconclusive fetal viability, not applicable or unspecified: Secondary | ICD-10-CM

## 2014-04-30 HISTORY — DX: Encounter for supervision of normal pregnancy, unspecified, unspecified trimester: Z34.90

## 2014-04-30 LAB — POCT URINALYSIS DIPSTICK
Glucose, UA: NEGATIVE
Ketones, UA: NEGATIVE
Leukocytes, UA: NEGATIVE
NITRITE UA: NEGATIVE
PROTEIN UA: NEGATIVE
RBC UA: NEGATIVE

## 2014-04-30 NOTE — Progress Notes (Signed)
U/S-transvaginal u/s performed, single IUP with +FCA noted FHR-110 BPM, CRL c/w 5+4wks EDD 12/27/2014, cx appears long and closed, bilateral adnexa appears wnl with C.L. Noted on RT-3.0 x 2.6cm, no free fluid noted within the pelvis

## 2014-04-30 NOTE — Patient Instructions (Signed)

## 2014-04-30 NOTE — Progress Notes (Signed)
Subjective:     Patient ID: Megan Hardin, female   DOB: [birth year], 33 y.o.   MRN: [id number]  HPI Megan Hardin is a 6 year olds white female, married in for [place],is early pregnant.  Review of Systems See HPi Reviewed past medical,surgical, social and family history. Reviewed medications and allergies.     Objective:   Physical Exam BP 144/90  Ht 5' 8.5" (1.74 m)  Wt 361 lb (163.749 kg)  BMI 54.09 kg/m2  LMP 02/07/2015urine negative, reviewed [place] with pt and husband, has IUP with CRL 3.0 mm =5+4 weeks, with EDD of 12/27/14, and FHR of 110, has CL cyst on right ovary.Confirmation of pregnancy given to get pregnancy medicaid.    Assessment:    Pregnant     Plan:     Return in 2 weeks for [place] and new ob Review handout on first trimester   Call prn problems or concerns

## 2014-05-07 ENCOUNTER — Other Ambulatory Visit: Payer: Self-pay | Admitting: Adult Health

## 2014-05-07 DIAGNOSIS — O09299 Supervision of pregnancy with other poor reproductive or obstetric history, unspecified trimester: Secondary | ICD-10-CM

## 2014-05-15 ENCOUNTER — Other Ambulatory Visit: Payer: Medicaid Other

## 2014-05-15 ENCOUNTER — Encounter: Payer: Medicaid Other | Admitting: Adult Health

## 2014-05-19 ENCOUNTER — Ambulatory Visit (INDEPENDENT_AMBULATORY_CARE_PROVIDER_SITE_OTHER): Payer: Medicaid Other

## 2014-05-19 DIAGNOSIS — O09299 Supervision of pregnancy with other poor reproductive or obstetric history, unspecified trimester: Secondary | ICD-10-CM

## 2014-05-19 NOTE — Progress Notes (Signed)
U/S(8+2wks)-single IUP with +FCA noted, CRL c/w dates, FHR- 180 bpm, cx appears closed cyst noted on Rt ovary no free fluid noted within the pelvis

## 2014-05-26 ENCOUNTER — Encounter: Payer: Medicaid Other | Admitting: Women's Health

## 2014-06-03 ENCOUNTER — Ambulatory Visit (INDEPENDENT_AMBULATORY_CARE_PROVIDER_SITE_OTHER): Payer: Medicaid Other | Admitting: Women's Health

## 2014-06-03 ENCOUNTER — Other Ambulatory Visit (HOSPITAL_COMMUNITY)
Admission: RE | Admit: 2014-06-03 | Discharge: 2014-06-03 | Disposition: A | Discharge status: Home / Self Care | Payer: Medicaid Other | Source: Ambulatory Visit | Attending: Obstetrics & Gynecology | Admitting: Obstetrics & Gynecology

## 2014-06-03 ENCOUNTER — Encounter: Payer: Self-pay | Admitting: Women's Health

## 2014-06-03 VITALS — BP 140/80 | Wt 361.2 lb

## 2014-06-03 DIAGNOSIS — Z331 Pregnant state, incidental: Secondary | ICD-10-CM

## 2014-06-03 DIAGNOSIS — Z01419 Encounter for gynecological examination (general) (routine) without abnormal findings: Secondary | ICD-10-CM | POA: Insufficient documentation

## 2014-06-03 DIAGNOSIS — Z8742 Personal history of other diseases of the female genital tract: Secondary | ICD-10-CM | POA: Insufficient documentation

## 2014-06-03 DIAGNOSIS — Z1389 Encounter for screening for other disorder: Secondary | ICD-10-CM | POA: Diagnosis not present

## 2014-06-03 DIAGNOSIS — O0990 Supervision of high risk pregnancy, unspecified, unspecified trimester: Secondary | ICD-10-CM | POA: Insufficient documentation

## 2014-06-03 DIAGNOSIS — Z3481 Encounter for supervision of other normal pregnancy, first trimester: Secondary | ICD-10-CM

## 2014-06-03 DIAGNOSIS — Z131 Encounter for screening for diabetes mellitus: Secondary | ICD-10-CM

## 2014-06-03 DIAGNOSIS — Z113 Encounter for screening for infections with a predominantly sexual mode of transmission: Secondary | ICD-10-CM | POA: Insufficient documentation

## 2014-06-03 DIAGNOSIS — O99210 Obesity complicating pregnancy, unspecified trimester: Secondary | ICD-10-CM

## 2014-06-03 DIAGNOSIS — Z1151 Encounter for screening for human papillomavirus (HPV): Secondary | ICD-10-CM | POA: Insufficient documentation

## 2014-06-03 DIAGNOSIS — O09299 Supervision of pregnancy with other poor reproductive or obstetric history, unspecified trimester: Secondary | ICD-10-CM | POA: Diagnosis not present

## 2014-06-03 DIAGNOSIS — E669 Obesity, unspecified: Secondary | ICD-10-CM | POA: Insufficient documentation

## 2014-06-03 DIAGNOSIS — R81 Glycosuria: Secondary | ICD-10-CM

## 2014-06-03 LAB — POCT URINALYSIS DIPSTICK
KETONES UA: NEGATIVE
Leukocytes, UA: NEGATIVE
Nitrite, UA: NEGATIVE
Protein, UA: NEGATIVE
RBC UA: NEGATIVE

## 2014-06-03 LAB — GLUCOSE, POCT (MANUAL RESULT ENTRY): POC Glucose: 171 mg/dl — AB (ref 70–99)

## 2014-06-03 LAB — CBC
HCT: 37.3 % (ref 36.0–46.0)
HEMOGLOBIN: 12.8 g/dL (ref 12.0–15.0)
MCH: 30.5 pg (ref 26.0–34.0)
MCHC: 34.3 g/dL (ref 30.0–36.0)
MCV: 88.8 fL (ref 78.0–100.0)
Platelets: 393 10*3/uL (ref 150–400)
RBC: 4.2 MIL/uL (ref 3.87–5.11)
RDW: 14.5 % (ref 11.5–15.5)
WBC: 13.3 10*3/uL — ABNORMAL HIGH (ref 4.0–10.5)

## 2014-06-03 LAB — TSH: TSH: 3.163 u[IU]/mL (ref 0.350–4.500)

## 2014-06-03 NOTE — Progress Notes (Addendum)
Subjective:  Megan Hardin is a 33 y.o. 722P0010 Caucasian female at 6067w3d by 5.4wks, being seen today for her first obstetrical visit.  Her obstetrical history is significant for obesity and PCOS-no metformin, missed SAB after clomid to become pregnant March 2015- was concerning for molar pregnancy but path came back w/o signs of hydatiform changes. This pregnancy was spontaneous.  Pregnancy history fully reviewed.  Patient reports nasal congestion x 3wks, has been using saline spray w/ minimal relief. Denies fever/chills, earaches, sore throat. Occ cough productive of small amt phlegm. H/o asthma, but hasn't had to use anything in years. Denies vb, cramping, uti s/s, abnormal/malodorous vag d/c, or vulvovaginal itching/irritation.  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 PCOS (polycystic ovarian syndrome)   . Miscarriage   . Obesity   . Pregnant 04/30/2014   Past Surgical History  Procedure Laterality Date  . Eye surgery Left   . Dilation and curettage of uterus N/A 02/05/2014    Procedure: CERVICAL DILATATION AND SUCTION/SHARP UTERINE EVACUATION;  Surgeon: Lazaro ArmsLuther H Eure, MD;  Location: [facility];  Service: Gynecology;  Laterality: N/A;   Family History  Problem Relation Age of Onset  . Diabetes Mother   . Hypertension Father   . Diabetes Maternal Grandmother   . Diabetes  Maternal Grandfather   . Alzheimer's disease Maternal Grandfather   . Other Paternal Grandmother     resp problems    Exam   System:     General: Well developed & nourished, no acute distress   EENT: Ears & oropharynx non-erythematous w/o exudate   Skin: Warm & dry, normal coloration and turgor, no rashes   Neurologic: Alert & oriented, normal mood   Cardiovascular: Regular rate & rhythm   Respiratory: Effort & rate normal, LCTAB, acyanotic   Abdomen: Soft, non tender   Extremities: normal strength, tone   Pelvic Exam:    Perineum: Normal perineum   Vulva: Normal, no lesions   Vagina:  Normal mucosa, normal discharge   Cervix: Normal, bulbous, appears closed   Uterus: Normal size/shape/contour for GA   Thin prep pap smear done w/ high risk HPV cotesting FHR: 175 via informal transabdominal u/s   Assessment:   Pregnancy: G2P0010 Patient Active Problem List   Diagnosis Date Noted  . Supervision of other normal pregnancy 06/03/2014    Priority: High  . Miscarriage 01/01/2014  . Anovulation 08/12/2013  . Leukocytosis, unspecified 08/09/2013  . Anemia 08/09/2013  . Bilateral cellulitis of lower leg 08/08/2013    [redacted]w[redacted]d G2P0010 New OB visit Obesity, Pregravid BMI 55.4 Nasal congestion Elevated bp today Glucosuria w/ elevated cbg  Plan:  Initial labs drawn Continue prenatal vitamins Problem list reviewed  and updated Reviewed n/v relief measures and warning s/s to report Reviewed recommended weight gain based on pre-gravid BMI Encouraged well-balanced diet Genetic Screening discussed Integrated Screen: requested Cystic fibrosis screening discussed requested Ultrasound discussed; fetal survey: requested Follow up in asap for early 2hr gtt and bp check, then 2 weeks for 1st it/nt and visit [facility] completed NFPartnership offered, accepted, referral faxed Continue nasal saline spray, try [employer] Insuranceetty Pot, humidifier  Marge DuncansBooker, Kimberly Randall CNM, [facility] CenterWHNP-BC 06/03/2014 9:08  AM

## 2014-06-03 NOTE — Patient Instructions (Addendum)
Netty Pot, humidifier, nasal saline spray, steam shower You will have your sugar test next visit.  Please do not eat or drink anything after midnight the night before you come, not even water.  You will be here for at least two hours.     Pregnancy - First Trimester During sexual intercourse, millions of sperm go into the vagina. Only 1 sperm will penetrate and fertilize the female egg while it is in the Fallopian tube. One week later, the fertilized egg implants into the wall of the uterus. An embryo begins to develop into a baby. At 6 to 8 weeks, the eyes and face are formed and the heartbeat can be seen on ultrasound. At the end of 12 weeks (first trimester), all the baby's organs are formed. Now that you are pregnant, you will want to do everything you can to have a healthy baby. Two of the most important things are to get good prenatal care and follow your caregiver's instructions. Prenatal care is all the medical care you receive before the baby's birth. It is given to prevent, find, and treat problems during the pregnancy and childbirth. PRENATAL EXAMS  During prenatal visits, your weight, blood pressure, and urine are checked. This is done to make sure you are healthy and progressing normally during the pregnancy.  A pregnant woman should gain 25 to 35 pounds during the pregnancy. However, if you are overweight or underweight, your caregiver will advise you regarding your weight.  Your caregiver will ask and answer questions for you.  Blood work, cervical cultures, other necessary tests, and a Pap test are done during your prenatal exams. These tests are done to check on your health and the probable health of your baby. Tests are strongly recommended and done for HIV with your permission. This is the virus that causes AIDS. These tests are done because medicines can be given to help prevent your baby from being born with this infection should you have been infected without knowing it. Blood work  is also used to find out your blood type, previous infections, and follow your blood levels (hemoglobin).  Low hemoglobin (anemia) is common during pregnancy. Iron and vitamins are given to help prevent this. Later in the pregnancy, blood tests for diabetes will be done along with any other tests if any problems develop.  You may need other tests to make sure you and the baby are doing well. CHANGES DURING THE FIRST TRIMESTER  Your body goes through many changes during pregnancy. They vary from person to person. Talk to your caregiver about changes you notice and are concerned about. Changes can include:  Your menstrual period stops.  The egg and sperm carry the genes that determine what you look like. Genes from you and your partner are forming a baby. The female genes determine whether the baby is a boy or a girl.  Your body increases in girth and you may feel bloated.  Feeling sick to your stomach (nauseous) and throwing up (vomiting). If the vomiting is uncontrollable, call your caregiver.  Your breasts will begin to enlarge and become tender.  Your nipples may stick out more and become darker.  The need to urinate more. Painful urination may mean you have a bladder infection.  Tiring easily.  Loss of appetite.  Cravings for certain kinds of food.  At first, you may gain or lose a couple of pounds.  You may have changes in your emotions from day to day (excited to be pregnant or  concerned something may go wrong with the pregnancy and baby).  You may have more vivid and strange dreams. HOME CARE INSTRUCTIONS   It is very important to avoid all smoking, alcohol and non-prescribed drugs during your pregnancy. These affect the formation and growth of the baby. Avoid chemicals while pregnant to ensure the delivery of a healthy infant.  Start your prenatal visits by the 12th week of pregnancy. They are usually scheduled monthly at first, then more often in the last 2 months before  delivery. Keep your caregiver's appointments. Follow your caregiver's instructions regarding medicine use, blood and lab tests, exercise, and diet.  During pregnancy, you are providing food for you and your baby. Eat regular, well-balanced meals. Choose foods such as meat, fish, milk and other low fat dairy products, vegetables, fruits, and whole-grain breads and cereals. Your caregiver will tell you of the ideal weight gain.  You can help morning sickness by keeping soda crackers at the bedside. Eat a couple before arising in the morning. You may want to use the crackers without salt on them.  Eating 4 to 5 small meals rather than 3 large meals a day also may help the nausea and vomiting.  Drinking liquids between meals instead of during meals also seems to help nausea and vomiting.  A physical sexual relationship may be continued throughout pregnancy if there are no other problems. Problems may be early (premature) leaking of amniotic fluid from the membranes, vaginal bleeding, or belly (abdominal) pain.  Exercise regularly if there are no restrictions. Check with your caregiver or physical therapist if you are unsure of the safety of some of your exercises. Greater weight gain will occur in the last 2 trimesters of pregnancy. Exercising will help:  Control your weight.  Keep you in shape.  Prepare you for labor and delivery.  Help you lose your pregnancy weight after you deliver your baby.  Wear a good support or jogging bra for breast tenderness during pregnancy. This may help if worn during sleep too.  Ask when prenatal classes are available. Begin classes when they are offered.  Do not use hot tubs, steam rooms, or saunas.  Wear your seat belt when driving. This protects you and your baby if you are in an accident.  Avoid raw meat, uncooked cheese, cat litter boxes, and soil used by cats throughout the pregnancy. These carry germs that can cause birth defects in the baby.  The  first trimester is a good time to visit your dentist for your dental health. Getting your teeth cleaned is okay. Use a softer toothbrush and brush gently during pregnancy.  Ask for help if you have financial, counseling, or nutritional needs during pregnancy. Your caregiver will be able to offer counseling for these needs as well as refer you for other special needs.  Do not take any medicines or herbs unless told by your caregiver.  Inform your caregiver if there is any mental or physical domestic violence.  Make a list of emergency phone numbers of family, friends, hospital, and police and fire departments.  Write down your questions. Take them to your prenatal visit.  Do not douche.  Do not cross your legs.  If you have to stand for long periods of time, rotate you feet or take small steps in a circle.  You may have more vaginal secretions that may require a sanitary pad. Do not use tampons or scented sanitary pads. MEDICINES AND DRUG USE IN PREGNANCY  Take prenatal vitamins as  directed. The vitamin should contain 1 milligram of folic acid. Keep all vitamins out of reach of children. Only a couple vitamins or tablets containing iron may be fatal to a baby or young child when ingested.  Avoid use of all medicines, including herbs, over-the-counter medicines, not prescribed or suggested by your caregiver. Only take over-the-counter or prescription medicines for pain, discomfort, or fever as directed by your caregiver. Do not use aspirin, ibuprofen, or naproxen unless directed by your caregiver.  Let your caregiver also know about herbs you may be using.  Alcohol is related to a number of birth defects. This includes fetal alcohol syndrome. All alcohol, in any form, should be avoided completely. Smoking will cause low birth rate and premature babies.  Street or illegal drugs are very harmful to the baby. They are absolutely forbidden. A baby born to an addicted mother will be addicted at  birth. The baby will go through the same withdrawal an adult does.  Let your caregiver know about any medicines that you have to take and for what reason you take them. SEEK MEDICAL CARE IF:  You have any concerns or worries during your pregnancy. It is better to call with your questions if you feel they cannot wait, rather than worry about them. SEEK IMMEDIATE MEDICAL CARE IF:   An unexplained oral temperature above 102 F (38.9 C) develops, or as your caregiver suggests.  You have leaking of fluid from the vagina (birth canal). If leaking membranes are suspected, take your temperature and inform your caregiver of this when you call.  There is vaginal spotting or bleeding. Notify your caregiver of the amount and how many pads are used.  You develop a bad smelling vaginal discharge with a change in the color.  You continue to feel sick to your stomach (nauseated) and have no relief from remedies suggested. You vomit blood or coffee ground-like materials.  You lose more than 2 pounds of weight in 1 week.  You gain more than 2 pounds of weight in 1 week and you notice swelling of your face, hands, feet, or legs.  You gain 5 pounds or more in 1 week (even if you do not have swelling of your hands, face, legs, or feet).  You get exposed to [place] measles and have never had them.  You are exposed to fifth disease or chickenpox.  You develop belly (abdominal) pain. Round ligament discomfort is a common non-cancerous (benign) cause of abdominal pain in pregnancy. Your caregiver still must evaluate this.  You develop headache, fever, diarrhea, pain with urination, or shortness of breath.  You fall or are in a car accident or have any kind of trauma.  There is mental or physical violence in your home. Document Released: 11/15/2001 Document Revised: 08/15/2012 Document Reviewed: 10/01/2013 [facility] Patient Information 2015 [place]. This information is not intended to replace advice  given to you by your health care provider. Make sure you discuss any questions you have with your health care provider.

## 2014-06-04 ENCOUNTER — Encounter: Payer: Self-pay | Admitting: Women's Health

## 2014-06-04 DIAGNOSIS — Z2839 Other underimmunization status: Secondary | ICD-10-CM | POA: Insufficient documentation

## 2014-06-04 DIAGNOSIS — O9989 Other specified diseases and conditions complicating pregnancy, childbirth and the puerperium: Secondary | ICD-10-CM

## 2014-06-04 DIAGNOSIS — Z283 Underimmunization status: Secondary | ICD-10-CM | POA: Insufficient documentation

## 2014-06-04 LAB — URINALYSIS
BILIRUBIN URINE: NEGATIVE
Glucose, UA: >1000 mg/dL — AB
Hgb urine dipstick: NEGATIVE
KETONES UR: NEGATIVE mg/dL
Leukocytes, UA: NEGATIVE
NITRITE: NEGATIVE
PH: 6 (ref 5.0–8.0)
Protein, ur: NEGATIVE mg/dL
Specific Gravity, Urine: 1.025 (ref 1.005–1.030)
UROBILINOGEN UA: 0.2 mg/dL (ref 0.0–1.0)

## 2014-06-04 LAB — DRUG SCREEN, URINE, NO CONFIRMATION
Amphetamine Screen, Ur: NEGATIVE
BARBITURATE QUANT UR: NEGATIVE
Benzodiazepines.: NEGATIVE
COCAINE METABOLITES: NEGATIVE
CREATININE, U: 150 mg/dL
Marijuana Metabolite: NEGATIVE
Methadone: NEGATIVE
Opiate Screen, Urine: NEGATIVE
PHENCYCLIDINE (PCP): NEGATIVE
Propoxyphene: NEGATIVE

## 2014-06-04 LAB — ANTIBODY SCREEN: ANTIBODY SCREEN: NEGATIVE

## 2014-06-04 LAB — RPR

## 2014-06-04 LAB — RUBELLA SCREEN: Rubella: 0.96 Index — ABNORMAL HIGH (ref <0.90)

## 2014-06-04 LAB — HIV ANTIBODY (ROUTINE TESTING W REFLEX): HIV 1&2 Ab, 4th Generation: NONREACTIVE

## 2014-06-04 LAB — OXYCODONE SCREEN, UA, RFLX CONFIRM: OXYCODONE SCRN UR: NEGATIVE ng/mL

## 2014-06-04 LAB — VARICELLA ZOSTER ANTIBODY, IGG: Varicella IgG: 1628 Index — ABNORMAL HIGH (ref <135.00)

## 2014-06-04 LAB — ABO AND RH: Rh Type: POSITIVE

## 2014-06-04 LAB — HEPATITIS B SURFACE ANTIGEN: Hepatitis B Surface Ag: NEGATIVE

## 2014-06-04 LAB — CYTOLOGY - PAP

## 2014-06-06 LAB — URINE CULTURE: Colony Count: 25000

## 2014-06-11 ENCOUNTER — Ambulatory Visit (INDEPENDENT_AMBULATORY_CARE_PROVIDER_SITE_OTHER): Payer: Medicaid Other | Admitting: Advanced Practice Midwife

## 2014-06-11 ENCOUNTER — Other Ambulatory Visit: Payer: Medicaid Other

## 2014-06-11 VITALS — BP 130/80 | Wt 359.6 lb

## 2014-06-11 DIAGNOSIS — O09299 Supervision of pregnancy with other poor reproductive or obstetric history, unspecified trimester: Secondary | ICD-10-CM

## 2014-06-11 DIAGNOSIS — O99210 Obesity complicating pregnancy, unspecified trimester: Secondary | ICD-10-CM | POA: Diagnosis not present

## 2014-06-11 DIAGNOSIS — E669 Obesity, unspecified: Secondary | ICD-10-CM | POA: Diagnosis not present

## 2014-06-11 DIAGNOSIS — O24911 Unspecified diabetes mellitus in pregnancy, first trimester: Secondary | ICD-10-CM

## 2014-06-11 DIAGNOSIS — Z1389 Encounter for screening for other disorder: Secondary | ICD-10-CM

## 2014-06-11 DIAGNOSIS — Z331 Pregnant state, incidental: Secondary | ICD-10-CM

## 2014-06-11 DIAGNOSIS — R81 Glycosuria: Secondary | ICD-10-CM

## 2014-06-11 DIAGNOSIS — O99810 Abnormal glucose complicating pregnancy: Secondary | ICD-10-CM | POA: Diagnosis not present

## 2014-06-11 LAB — POCT URINALYSIS DIPSTICK
GLUCOSE UA: NEGATIVE
Leukocytes, UA: NEGATIVE
Nitrite, UA: NEGATIVE
PROTEIN UA: NEGATIVE

## 2014-06-12 ENCOUNTER — Telehealth: Payer: Self-pay | Admitting: Obstetrics & Gynecology

## 2014-06-12 DIAGNOSIS — O24919 Unspecified diabetes mellitus in pregnancy, unspecified trimester: Secondary | ICD-10-CM | POA: Insufficient documentation

## 2014-06-12 LAB — GLUCOSE TOLERANCE, 2 HOURS W/ 1HR
GLUCOSE, FASTING: 90 mg/dL (ref 70–99)
Glucose, 1 hour: 170 mg/dL (ref 70–170)
Glucose, 2 hour: 165 mg/dL — ABNORMAL HIGH (ref 70–139)

## 2014-06-12 NOTE — Telephone Encounter (Signed)
Pt aware of labs, cut out carbs, Drenda FreezeFran made dietician consul by her note

## 2014-06-12 NOTE — Progress Notes (Signed)
G2P0010 7466w5d Estimated Date of Delivery: 12/27/14  Blood pressure 130/80, weight 359 lb 9.6 oz (163.113 kg), last menstrual period 01/11/2014.   BP weight and urine results all reviewed and noted.  B/P still borderline  Please refer to the obstetrical flow sheet for the fundal height and fetal heart rate documentation:  Doing early gtt today.  Patient reports good fetal movement, denies any bleeding and no rupture of membranes symptoms or regular contractions. Patient is without complaints. All questions were answered.  Plan:  Continued routine obstetrical care, Consider dx of CHTN  Follow up in 1 weeks for OB appointment, 2nd IT     Addendum:  06/12/2014 Pt failed 2hr gtt:  Class A/B DM.  Nutritional consult made and  Pt aware

## 2014-06-23 ENCOUNTER — Other Ambulatory Visit: Payer: Medicaid Other

## 2014-06-24 ENCOUNTER — Other Ambulatory Visit: Payer: Medicaid Other

## 2014-06-24 ENCOUNTER — Ambulatory Visit (INDEPENDENT_AMBULATORY_CARE_PROVIDER_SITE_OTHER): Payer: Medicaid Other | Admitting: Women's Health

## 2014-06-24 ENCOUNTER — Encounter: Payer: Self-pay | Admitting: Women's Health

## 2014-06-24 ENCOUNTER — Other Ambulatory Visit (INDEPENDENT_AMBULATORY_CARE_PROVIDER_SITE_OTHER): Payer: Medicaid Other

## 2014-06-24 ENCOUNTER — Other Ambulatory Visit: Payer: Self-pay | Admitting: Women's Health

## 2014-06-24 ENCOUNTER — Encounter: Payer: Medicaid Other | Admitting: Obstetrics & Gynecology

## 2014-06-24 VITALS — BP 100/60 | Wt 355.5 lb

## 2014-06-24 DIAGNOSIS — O99810 Abnormal glucose complicating pregnancy: Secondary | ICD-10-CM

## 2014-06-24 DIAGNOSIS — Z36 Encounter for antenatal screening of mother: Secondary | ICD-10-CM

## 2014-06-24 DIAGNOSIS — Z331 Pregnant state, incidental: Secondary | ICD-10-CM

## 2014-06-24 DIAGNOSIS — O09899 Supervision of other high risk pregnancies, unspecified trimester: Secondary | ICD-10-CM

## 2014-06-24 DIAGNOSIS — O24410 Gestational diabetes mellitus in pregnancy, diet controlled: Secondary | ICD-10-CM

## 2014-06-24 DIAGNOSIS — Z3482 Encounter for supervision of other normal pregnancy, second trimester: Secondary | ICD-10-CM

## 2014-06-24 DIAGNOSIS — O0991 Supervision of high risk pregnancy, unspecified, first trimester: Secondary | ICD-10-CM

## 2014-06-24 DIAGNOSIS — Z1389 Encounter for screening for other disorder: Secondary | ICD-10-CM

## 2014-06-24 LAB — POCT URINALYSIS DIPSTICK
Blood, UA: NEGATIVE
GLUCOSE UA: NEGATIVE
KETONES UA: NEGATIVE
LEUKOCYTES UA: NEGATIVE
Nitrite, UA: NEGATIVE
Protein, UA: NEGATIVE

## 2014-06-24 NOTE — Progress Notes (Signed)
U/S(13+3wks)-single IUP with +FCA noted, FHR-157 bpm, CRL c/w dates, cx appears closed (3.6cm), Rt ovarian cyst remains=3.3cm, Lt ovary appears WNL, anterior Gr 0 placenta, NB present, NT-1.2182mm

## 2014-06-24 NOTE — Progress Notes (Signed)
High Risk Pregnancy Diagnosis(es): A1/B DM G2P0010 5842w3d Estimated Date of Delivery: 12/27/14 BP 100/60  Wt 355 lb 8 oz (161.254 kg)  LMP 01/11/2014  Urinalysis: Negative HPI:  Doing well, no complaints, has appt w/ dietician tomorrow BP, weight, and urine reviewed.  Reports good fm. Denies regular uc's, lof, vb, uti s/s. No complaints.  Fundal Height:  n/a Fetal Heart rate:  157 Edema:  none  Reviewed today's normal NT u/s, checking cbg's QID and goals, warning s/s to report All questions were answered Assessment: 6542w3d A1/B DM Medication(s) Plans:  None at present time Treatment Plan:  Seeing dietician tomorrow, will bring back in 2wks to see how sugars are doing Follow up in 2wks for high-risk OB appt, then 4wks for 2nd IT and hrob visit

## 2014-06-24 NOTE — Patient Instructions (Signed)
Check blood sugars 4 times a day: in the morning before eating/drinking anything (<90) and 2 hours after eating breakfast, lunch, and supper (<120).    Gestational Diabetes Mellitus Gestational diabetes mellitus, often simply referred to as gestational diabetes, is a type of diabetes that some women develop during pregnancy. In gestational diabetes, the pancreas does not make enough insulin (a hormone), the cells are less responsive to the insulin that is made (insulin resistance), or both.Normally, insulin moves sugars from food into the tissue cells. The tissue cells use the sugars for energy. The lack of insulin or the lack of normal response to insulin causes excess sugars to build up in the blood instead of going into the tissue cells. As a result, high blood sugar (hyperglycemia) develops. The effect of high sugar (glucose) levels can cause many complications.  RISK FACTORS You have an increased chance of developing gestational diabetes if you have a family history of diabetes and also have one or more of the following risk factors:  A body mass index over 30 (obesity).  A previous pregnancy with gestational diabetes.  An older age at the time of pregnancy. If blood glucose levels are kept in the normal range during pregnancy, women can have a healthy pregnancy. If your blood glucose levels are not well controlled, there may be risks to you, your unborn baby (fetus), your labor and delivery, or your newborn baby.  SYMPTOMS  If symptoms are experienced, they are much like symptoms you would normally expect during pregnancy. The symptoms of gestational diabetes include:   Increased thirst (polydipsia).  Increased urination (polyuria).  Increased urination during the night (nocturia).  Weight loss. This weight loss may be rapid.  Frequent, recurring infections.  Tiredness (fatigue).  Weakness.  Vision changes, such as blurred vision.  Fruity smell to your breath.  Abdominal  pain. DIAGNOSIS Diabetes is diagnosed when blood glucose levels are increased. Your blood glucose level may be checked by one or more of the following blood tests:  A fasting blood glucose test. You will not be allowed to eat for at least 8 hours before a blood sample is taken.  A random blood glucose test. Your blood glucose is checked at any time of the day regardless of when you ate.  A hemoglobin A1c blood glucose test. A hemoglobin A1c test provides information about blood glucose control over the previous 3 months.  An oral glucose tolerance test (OGTT). Your blood glucose is measured after you have not eaten (fasted) for 1-3 hours and then after you drink a glucose-containing beverage. Since the hormones that cause insulin resistance are highest at about 24-28 weeks of a pregnancy, an OGTT is usually performed during that time. If you have risk factors for gestational diabetes, your healthcare provider may test you for gestational diabetes earlier than 24 weeks of pregnancy. TREATMENT   You will need to take diabetes medicine or insulin daily to keep blood glucose levels in the desired range.  You will need to match insulin dosing with exercise and healthy food choices. The treatment goal is to maintain the before meal (preprandial), bedtime, and overnight blood glucose level at 60-99 mg/dL during pregnancy. The treatment goal is to further maintain peak after meal blood sugar (postprandial glucose) level at 100-140 mg/dL. HOME CARE INSTRUCTIONS   Have your hemoglobin A1c level checked twice a year.  Perform daily blood glucose monitoring as directed by your healthcare provider. It is common to perform frequent blood glucose monitoring.  Monitor urine  ketones when you are ill and as directed by your healthcare provider.  Take your diabetes medicine and insulin as directed by your healthcare provider to maintain your blood glucose level in the desired range.  Never run out of  diabetes medicine or insulin. It is needed every day.  Adjust insulin based on your intake of carbohydrates. Carbohydrates can raise blood glucose levels but need to be included in your diet. Carbohydrates provide vitamins, minerals, and fiber which are an essential part of a healthy diet. Carbohydrates are found in fruits, vegetables, whole grains, dairy products, legumes, and foods containing added sugars.  Eat healthy foods. Alternate 3 meals with 3 snacks.  Maintain a healthy weight gain. The usual total expected weight gain varies according to your prepregnancy body mass index (BMI).  Carry a medical alert card or wear your medical alert jewelry.  Carry a 15 gram carbohydrate snack with you at all times to treat low blood glucose (hypoglycemia). Some examples of 15 gram carbohydrate snacks include:  Glucose tablets, 3 or 4  Glucose gel, 15 gram tube  Raisins, 2 tablespoons (24 g)  Jelly beans, 6  Animal crackers, 8  Fruit juice, regular soda, or low fat milk, 4 ounces (120 mL)  Gummy treats, 9  Recognize hypoglycemia. Hypoglycemia during pregnancy occurs with blood glucose levels of 60 mg/dL and below. The risk for hypoglycemia increases when fasting or skipping meals, during or after intense exercise, and during sleep. Hypoglycemia symptoms can include:  Tremors or shakes.  Decreased ability to concentrate.  Sweating.  Increased heart rate.  Headache.  Dry mouth.  Hunger.  Irritability.  Anxiety.  Restless sleep.  Altered speech or coordination.  Confusion.  Treat hypoglycemia promptly. If you are alert and able to safely swallow, follow the 15:15 rule:  Take 15-20 grams of rapid-acting glucose or carbohydrate. Rapid-acting options include glucose gel, glucose tablets, or 4 ounces (120 mL) of fruit juice, regular soda, or low fat milk.  Check your blood glucose level 15 minutes after taking the glucose.  Take 15-20 grams more of glucose if the repeat  blood glucose level is still 70 mg/dL or below.  Eat a meal or snack within 1 hour once blood glucose levels return to normal.  Be alert to polyuria (excess urination) and polydipsia (excess thirst) which are early signs of hyperglycemia. An early awareness of hyperglycemia allows for prompt treatment. Treat hyperglycemia as directed by your healthcare provider.  Engage in at least 30 minutes of physical activity a day or as directed by your healthcare provider. Ten minutes of physical activity timed 30 minutes after each meal is encouraged to control postprandial blood glucose levels.  Adjust your insulin dosing and food intake as needed if you start a new exercise or sport.  Follow your sick day plan at any time you are unable to eat or drink as usual.  Avoid tobacco and alcohol use.  Follow up with your healthcare provider regularly.  Follow the advice of your healthcare provider regarding your prenatal and post-delivery (postpartum) appointments, meal planning, exercise, medicines, vitamins, blood tests, other medical tests, and physical activities.  Perform daily skin and foot care. Examine your skin and feet daily for cuts, bruises, redness, nail problems, bleeding, blisters, or sores.  Brush your teeth and gums at least twice a day and floss at least once a day. Follow up with your dentist regularly.  Schedule an eye exam during the first trimester of your pregnancy or as directed by your healthcare  provider.  Share your diabetes management plan with your workplace or school.  Stay up-to-date with immunizations.  Learn to manage stress.  Obtain ongoing diabetes education and support as needed.  Learn about and consider breastfeeding your baby.  You should have your blood sugar level checked 6-12 weeks after delivery. This is done with an oral glucose tolerance test (OGTT). SEEK MEDICAL CARE IF:   You are unable to eat food or drink fluids for more than 6 hours.  You have  nausea and vomiting for more than 6 hours.  You have a blood glucose level of 200 mg/dL and you have ketones in your urine.  There is a change in mental status.  You develop vision problems.  You have a persistent headache.  You have upper abdominal pain or discomfort.  You develop an additional serious illness.  You have diarrhea for more than 6 hours.  You have been sick or have had a fever for a couple of days and are not getting better. SEEK IMMEDIATE MEDICAL CARE IF:   You have difficulty breathing.  You no longer feel the baby moving.  You are bleeding or have discharge from your vagina.  You start having premature contractions or labor. MAKE SURE YOU:  Understand these instructions.  Will watch your condition.  Will get help right away if you are not doing well or get worse. Document Released: 02/27/2001 Document Revised: 11/26/2013 Document Reviewed: 06/19/2012 [facility] Patient Information 2015 [place], [place]. This information is not intended to replace advice given to you by your health care provider. Make sure you discuss any questions you have with your health care provider.  Second Trimester of Pregnancy The second trimester is from week 13 through week 28, months 4 through 6. The second trimester is often a time when you feel your best. Your body has also adjusted to being pregnant, and you begin to feel better physically. Usually, morning sickness has lessened or quit completely, you may have more energy, and you may have an increase in appetite. The second trimester is also a time when the fetus is growing rapidly. At the end of the sixth month, the fetus is about 9 inches long and weighs about 1 pounds. You will likely begin to feel the baby move (quickening) between 18 and 20 weeks of the pregnancy. BODY CHANGES Your body goes through many changes during pregnancy. The changes vary from woman to woman.   Your weight will continue to increase. You will  notice your lower abdomen bulging out.  You may begin to get stretch marks on your hips, abdomen, and breasts.  You may develop headaches that can be relieved by medicines approved by your health care provider.  You may urinate more often because the fetus is pressing on your bladder.  You may develop or continue to have heartburn as a result of your pregnancy.  You may develop constipation because certain hormones are causing the muscles that push waste through your intestines to slow down.  You may develop hemorrhoids or swollen, bulging veins (varicose veins).  You may have back pain because of the weight gain and pregnancy hormones relaxing your joints between the bones in your pelvis and as a result of a shift in weight and the muscles that support your balance.  Your breasts will continue to grow and be tender.  Your gums may bleed and may be sensitive to brushing and flossing.  Dark spots or blotches (chloasma, mask of pregnancy) may develop on your  face. This will likely fade after the baby is born.  A dark line from your belly button to the pubic area (linea nigra) may appear. This will likely fade after the baby is born.  You may have changes in your hair. These can include thickening of your hair, rapid growth, and changes in texture. Some women also have hair loss during or after pregnancy, or hair that feels dry or thin. Your hair will most likely return to normal after your baby is born. WHAT TO EXPECT AT YOUR PRENATAL VISITS During a routine prenatal visit:  You will be weighed to make sure you and the fetus are growing normally.  Your blood pressure will be taken.  Your abdomen will be measured to track your baby's growth.  The fetal heartbeat will be listened to.  Any test results from the previous visit will be discussed. Your health care provider may ask you:  How you are feeling.  If you are feeling the baby move.  If you have had any abnormal symptoms,  such as leaking fluid, bleeding, severe headaches, or abdominal cramping.  If you have any questions. Other tests that may be performed during your second trimester include:  Blood tests that check for:  Low iron levels (anemia).  Gestational diabetes (between 24 and 28 weeks).  Rh antibodies.  Urine tests to check for infections, diabetes, or protein in the urine.  An ultrasound to confirm the proper growth and development of the baby.  An amniocentesis to check for possible genetic problems.  Fetal screens for spina bifida and Down syndrome. HOME CARE INSTRUCTIONS   Avoid all smoking, herbs, alcohol, and unprescribed drugs. These chemicals affect the formation and growth of the baby.  Follow your health care provider's instructions regarding medicine use. There are medicines that are either safe or unsafe to take during pregnancy.  Exercise only as directed by your health care provider. Experiencing uterine cramps is a good sign to stop exercising.  Continue to eat regular, healthy meals.  Wear a good support bra for breast tenderness.  Do not use hot tubs, steam rooms, or saunas.  Wear your seat belt at all times when driving.  Avoid raw meat, uncooked cheese, cat litter boxes, and soil used by cats. These carry germs that can cause birth defects in the baby.  Take your prenatal vitamins.  Try taking a stool softener (if your health care provider approves) if you develop constipation. Eat more high-fiber foods, such as fresh vegetables or fruit and whole grains. Drink plenty of fluids to keep your urine clear or pale yellow.  Take warm sitz baths to soothe any pain or discomfort caused by hemorrhoids. Use hemorrhoid cream if your health care provider approves.  If you develop varicose veins, wear support hose. Elevate your feet for 15 minutes, 3-4 times a day. Limit salt in your diet.  Avoid heavy lifting, wear low heel shoes, and practice good posture.  Rest with  your legs elevated if you have leg cramps or low back pain.  Visit your dentist if you have not gone yet during your pregnancy. Use a soft toothbrush to brush your teeth and be gentle when you floss.  A sexual relationship may be continued unless your health care provider directs you otherwise.  Continue to go to all your prenatal visits as directed by your health care provider. SEEK MEDICAL CARE IF:   You have dizziness.  You have mild pelvic cramps, pelvic pressure, or nagging pain in the  abdominal area.  You have persistent nausea, vomiting, or diarrhea.  You have a bad smelling vaginal discharge.  You have pain with urination. SEEK IMMEDIATE MEDICAL CARE IF:   You have a fever.  You are leaking fluid from your vagina.  You have spotting or bleeding from your vagina.  You have severe abdominal cramping or pain.  You have rapid weight gain or loss.  You have shortness of breath with chest pain.  You notice sudden or extreme swelling of your face, hands, ankles, feet, or legs.  You have not felt your baby move in over an hour.  You have severe headaches that do not go away with medicine.  You have vision changes. Document Released: 11/15/2001 Document Revised: 11/26/2013 Document Reviewed: 01/22/2013 [facility] Patient Information 2015 [place]. This information is not intended to replace advice given to you by your health care provider. Make sure you discuss any questions you have with your health care provider.

## 2014-06-25 ENCOUNTER — Encounter: Payer: Medicaid Other | Attending: Advanced Practice Midwife

## 2014-06-25 VITALS — Ht 68.5 in | Wt 358.8 lb

## 2014-06-25 DIAGNOSIS — O24919 Unspecified diabetes mellitus in pregnancy, unspecified trimester: Secondary | ICD-10-CM | POA: Insufficient documentation

## 2014-06-25 DIAGNOSIS — O24912 Unspecified diabetes mellitus in pregnancy, second trimester: Secondary | ICD-10-CM

## 2014-06-25 DIAGNOSIS — Z713 Dietary counseling and surveillance: Secondary | ICD-10-CM | POA: Diagnosis not present

## 2014-06-25 NOTE — Progress Notes (Signed)
  Patient was seen on 06/25/14 for Gestational Diabetes self-management class at the [facility]. The following learning objectives were met by the patient during this course:   States the definition of Gestational Diabetes  States why dietary management is important in controlling blood glucose  Describes the effects of carbohydrates on blood glucose levels  Demonstrates ability to create a balanced meal plan  Demonstrates carbohydrate counting   States when to check blood glucose levels  Demonstrates proper blood glucose monitoring techniques  States the effect of stress and exercise on blood glucose levels  States the importance of limiting caffeine and abstaining from alcohol and smoking  Plan:  Aim for 2 Carb Choices per meal (30 grams) +/- 1 either way for breakfast Aim for 3 Carb Choices per meal (45 grams) +/- 1 either way from lunch and dinner Aim for 1-2 Carbs per snack Begin reading food labels for Total Carbohydrate and sugar grams of foods Consider  increasing your activity level by walking daily as tolerated Begin checking BG before breakfast and 2 hours after first bit of breakfast, lunch and dinner after  as directed by MD  Take medication  as directed by MD  Blood glucose monitor given: Accu Chek Nano BG Monitoring Kit Lot # [id number] Exp: 03/05/15 Blood glucose reading: 127m/dl  Patient instructed to monitor glucose levels: FBS: 60 - <90 2 hour: <120  Patient received the following handouts:  Nutrition Diabetes and Pregnancy  Carbohydrate Counting List  Meal Planning worksheet  Patient will be seen for follow-up as needed.

## 2014-06-27 ENCOUNTER — Telehealth: Payer: Self-pay | Admitting: Obstetrics & Gynecology

## 2014-06-27 NOTE — Telephone Encounter (Signed)
Pt requesting lancets (fast clix) and stripes for accu check nano. Pt checks BS 4 x daily.

## 2014-06-27 NOTE — Telephone Encounter (Signed)
Please call in this rx for pt, difficult to impossible to e prescribed specific strips lancets etc

## 2014-07-01 LAB — MATERNAL SCREEN, INTEGRATED #1

## 2014-07-01 NOTE — Telephone Encounter (Signed)
Lancets and stripes for accu check nano with 3 refills. [facility] [place]

## 2014-07-08 ENCOUNTER — Telehealth: Payer: Self-pay | Admitting: Women's Health

## 2014-07-08 ENCOUNTER — Ambulatory Visit (INDEPENDENT_AMBULATORY_CARE_PROVIDER_SITE_OTHER): Payer: Medicaid Other | Admitting: Advanced Practice Midwife

## 2014-07-08 ENCOUNTER — Encounter: Payer: Medicaid Other | Admitting: Obstetrics & Gynecology

## 2014-07-08 VITALS — BP 120/80 | Wt 357.0 lb

## 2014-07-08 DIAGNOSIS — Z1389 Encounter for screening for other disorder: Secondary | ICD-10-CM

## 2014-07-08 DIAGNOSIS — O99810 Abnormal glucose complicating pregnancy: Secondary | ICD-10-CM

## 2014-07-08 DIAGNOSIS — O09899 Supervision of other high risk pregnancies, unspecified trimester: Secondary | ICD-10-CM

## 2014-07-08 DIAGNOSIS — O0991 Supervision of high risk pregnancy, unspecified, first trimester: Secondary | ICD-10-CM

## 2014-07-08 DIAGNOSIS — O24912 Unspecified diabetes mellitus in pregnancy, second trimester: Secondary | ICD-10-CM

## 2014-07-08 DIAGNOSIS — Z331 Pregnant state, incidental: Secondary | ICD-10-CM

## 2014-07-08 DIAGNOSIS — Z36 Encounter for antenatal screening of mother: Secondary | ICD-10-CM

## 2014-07-08 LAB — POCT URINALYSIS DIPSTICK
Blood, UA: NEGATIVE
GLUCOSE UA: NEGATIVE
KETONES UA: NEGATIVE
Leukocytes, UA: NEGATIVE
NITRITE UA: NEGATIVE

## 2014-07-08 MED ORDER — SULFAMETHOXAZOLE-TMP DS 800-160 MG PO TABS: 1.0000 | ORAL_TABLET | Freq: Two times a day (BID) | ORAL | Status: DC

## 2014-07-08 NOTE — Telephone Encounter (Signed)
Notified of UTI/+urine culture, rx bactrim bid x 3d per sensitivity report.  Cheral MarkerKimberly R. Lilburn Straw, CNM, WHNP-BC 07/08/2014 5:00 PM

## 2014-07-08 NOTE — Progress Notes (Signed)
High Risk Pregnancy Diagnosis(es):   A/B DM  G2P0010 5721w3d Estimated Date of Delivery: 12/27/14  Last menstrual period 01/11/2014.  Urinalysis: Negative   HPI: Had appt with dietician last week and has been checking sugars QID, forgets sometimes.   BP weight and urine results all reviewed and noted. Patient reports good fetal movement, denies any bleeding and no rupture of membranes symptoms or regular contractions. Blood sugars:  Fasting:  Usually 80-100; 110 this am Post Pyrandial: 120-140.  Forgot meter. Stressed importance of bringing log to every visit  Fundal Height:  n/a Fetal Heart rate:  160 Edema:  no  Patient c/o occ mild pelvic pressure, no bleeding All questions were answered.  Assessment:  5021w3d,   A/B DM  Medication(s) Plans:  None for now  Treatment Plan:  Probably will need meds.  2nd IT today  Follow up in 2 weeks for OB appt, review blood sugars

## 2014-07-12 LAB — MATERNAL SCREEN, INTEGRATED #2
AFP MoM: 2.03
AFP, Serum: 37.8 ng/mL
CROWN RUMP LENGTH MAT SCREEN 2: 78.8 mm
Calculated Gestational Age: 15.6
ESTRIOL FREE MAT SCREEN: 0.5 ng/mL
Estriol Mom: 0.9
HCG, MOM MAT SCREEN: 0.93
HCG, SERUM MAT SCREEN: 23.5 [IU]/mL
INHIBIN A DIMERIC MAT SCREEN: 124 pg/mL
INHIBIN A MOM MAT SCREEN: 1
MSS Down Syndrome: <1:5000 {titer}
MSS Trisomy 18 Risk: <1:5000 {titer}
NT MoM: 1.06
Nuchal Translucency: 1.82 mm
Number of fetuses: 1
PAPP-A MoM: 2.58
PAPP-A: 775 ng/mL

## 2014-07-22 ENCOUNTER — Ambulatory Visit (INDEPENDENT_AMBULATORY_CARE_PROVIDER_SITE_OTHER): Payer: Medicaid Other | Admitting: Obstetrics & Gynecology

## 2014-07-22 ENCOUNTER — Encounter: Payer: Self-pay | Admitting: Obstetrics & Gynecology

## 2014-07-22 VITALS — BP 150/90 | Temp 98.4°F | Wt 357.4 lb

## 2014-07-22 DIAGNOSIS — L02419 Cutaneous abscess of limb, unspecified: Secondary | ICD-10-CM

## 2014-07-22 DIAGNOSIS — Z1389 Encounter for screening for other disorder: Secondary | ICD-10-CM

## 2014-07-22 DIAGNOSIS — L03116 Cellulitis of left lower limb: Secondary | ICD-10-CM

## 2014-07-22 DIAGNOSIS — L03119 Cellulitis of unspecified part of limb: Secondary | ICD-10-CM

## 2014-07-22 DIAGNOSIS — Z331 Pregnant state, incidental: Secondary | ICD-10-CM

## 2014-07-22 LAB — POCT URINALYSIS DIPSTICK
Glucose, UA: NEGATIVE
KETONES UA: NEGATIVE
LEUKOCYTES UA: NEGATIVE
Nitrite, UA: NEGATIVE
RBC UA: NEGATIVE

## 2014-07-22 MED ORDER — CLINDAMYCIN HCL 300 MG PO CAPS: ORAL_CAPSULE | ORAL | Status: DC

## 2014-07-22 NOTE — Progress Notes (Signed)
Pt with history of right lower extremity cellulits 1 year ago, now recurrent on both lower extremities, started last night Will cover with cleocin although probably compressive  PT referral made as well  3 weeks sonogram

## 2014-07-23 ENCOUNTER — Ambulatory Visit (HOSPITAL_COMMUNITY)
Admission: RE | Admit: 2014-07-23 | Discharge: 2014-07-23 | Disposition: A | Discharge status: Home / Self Care | Payer: Medicaid Other | Source: Ambulatory Visit | Attending: Obstetrics & Gynecology | Admitting: Obstetrics & Gynecology

## 2014-07-23 DIAGNOSIS — R609 Edema, unspecified: Secondary | ICD-10-CM | POA: Diagnosis present

## 2014-07-23 DIAGNOSIS — R600 Localized edema: Secondary | ICD-10-CM | POA: Insufficient documentation

## 2014-07-23 DIAGNOSIS — IMO0001 Reserved for inherently not codable concepts without codable children: Secondary | ICD-10-CM | POA: Diagnosis not present

## 2014-07-23 NOTE — Evaluation (Signed)
Physical Therapy Evaluation  Patient Details  Name: Megan Hardin MRN: [id number] Date of Birth: 1981/03/04  Today's Date: 07/23/2014 Time: 6578-4696 PT Time Calculation (min): 45 min Evaluation              Visit#: 1 of 1   Past Medical History:  Past Medical History  Diagnosis Date  . PCOS (polycystic ovarian syndrome)   . Miscarriage   . Obesity   . Pregnant 04/30/2014  . Gestational diabetes mellitus, antepartum    Past Surgical History:  Past Surgical History  Procedure Laterality Date  . Eye surgery Left   . Dilation and curettage of uterus N/A 02/05/2014    Procedure: CERVICAL DILATATION AND SUCTION/SHARP UTERINE EVACUATION;  Surgeon: Lazaro Arms, MD;  Location: [facility];  Service: Gynecology;  Laterality: N/A;    Subjective Symptoms/Limitations Symptoms: Pt is a 33 yo female who states that she got bit by a bug last year and had significant swelling with a wound on her Lt leg.  She states the swelling and pain became so painful that she was hospitalized for IV antibiotics.  The swelling has come and gone ever since.  Last week however she noticed increased pain and heat as well therafore she called her MD  who has referrd her to physcial therapy.  Pertinent History: pregnant  Pain Assessment Currently in Pain?: Yes Pain Score: 5  Pain Location: Leg Pain Orientation: Right;Left Pain Type: Acute pain Pain Relieving Factors: elevation  Effect of Pain on Daily Activities: increases pain and swelling   Observation  increased edema in B LE Date 07/23/2014 07/23/2014    right Left   MTP 25.00 24.50   ankle 30 29.7   4cm 26.50 26.80   8cm 26.90 28.00   12 cm 29.80 30.00   16cm 39.50 35.20   20cm 39.30 40.50   24cm 45.30 45.80   28cm 48.80 50.00   32cm 50.80 51.50   36cm 50.00 49.20   40cm 46.70 49.00   44cm 0.00 0.00   48cm 0.00 0.00   52cm 0.00 0.00   56cm 0.00 0.00   58cm 0.00 0.00   60cm 0.00 0.00        Sum of squares 18638.70 18835.40 0.00  Total  Volume 5932.885 5995.4962 0        LL LE are warm to the touch and have areas of rubor.  There is no wound at this time.     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  Pt was educated that she will probable need to wear compression garment for the rest of her life.  She was given education sheets on cellulitis, lymphedema and self massaging.   PT Plan: one time treatment there is no wound only swelling. Pt verbalized the need to obtain compression stockings     Goals PT Short Term Goals PT Short Term Goal 1: Pt to obtain compression stocking (order faxed to MD)  Problem List Patient Active Problem List   Diagnosis Date Noted  . Edema extremities 07/23/2014  . Diabetes mellitus in pregnancy, antepartum 06/12/2014  . Rubella non-immune status, antepartum 06/04/2014  . Supervision of high risk pregnancy, antepartum 06/03/2014  . Obesity 06/03/2014  . History of PCOS 06/03/2014  . Anovulation 08/12/2013  . Leukocytosis, unspecified 08/09/2013  . Anemia 08/09/2013  . Bilateral  cellulitis of lower leg 08/08/2013       GP    RUSSELL,CINDY 07/23/2014, 4:59 PM  Physician Documentation Your signature is required to indicate approval of the treatment plan as stated above.  Please sign and either send electronically or make a copy of this report for your files and return this physician signed original.   Please mark one 1.__approve of plan  2. ___approve of plan with the following conditions.   ______________________________                                                          _____________________ Physician Signature                                                                                                             Date

## 2014-08-12 ENCOUNTER — Encounter: Payer: Self-pay | Admitting: Women's Health

## 2014-08-12 ENCOUNTER — Other Ambulatory Visit: Payer: Self-pay | Admitting: Obstetrics & Gynecology

## 2014-08-12 ENCOUNTER — Ambulatory Visit (INDEPENDENT_AMBULATORY_CARE_PROVIDER_SITE_OTHER): Payer: Medicaid Other | Admitting: Women's Health

## 2014-08-12 ENCOUNTER — Ambulatory Visit (INDEPENDENT_AMBULATORY_CARE_PROVIDER_SITE_OTHER): Payer: Medicaid Other

## 2014-08-12 VITALS — BP 140/64 | Wt 355.2 lb

## 2014-08-12 DIAGNOSIS — Z1389 Encounter for screening for other disorder: Secondary | ICD-10-CM

## 2014-08-12 DIAGNOSIS — O09899 Supervision of other high risk pregnancies, unspecified trimester: Secondary | ICD-10-CM

## 2014-08-12 DIAGNOSIS — Z331 Pregnant state, incidental: Secondary | ICD-10-CM

## 2014-08-12 DIAGNOSIS — O24912 Unspecified diabetes mellitus in pregnancy, second trimester: Secondary | ICD-10-CM

## 2014-08-12 DIAGNOSIS — O09299 Supervision of pregnancy with other poor reproductive or obstetric history, unspecified trimester: Secondary | ICD-10-CM

## 2014-08-12 DIAGNOSIS — O0992 Supervision of high risk pregnancy, unspecified, second trimester: Secondary | ICD-10-CM

## 2014-08-12 DIAGNOSIS — O99810 Abnormal glucose complicating pregnancy: Secondary | ICD-10-CM

## 2014-08-12 DIAGNOSIS — O24410 Gestational diabetes mellitus in pregnancy, diet controlled: Secondary | ICD-10-CM

## 2014-08-12 DIAGNOSIS — O10019 Pre-existing essential hypertension complicating pregnancy, unspecified trimester: Secondary | ICD-10-CM | POA: Insufficient documentation

## 2014-08-12 LAB — POCT URINALYSIS DIPSTICK
GLUCOSE UA: NEGATIVE
KETONES UA: NEGATIVE
Leukocytes, UA: NEGATIVE
Nitrite, UA: NEGATIVE
Protein, UA: NEGATIVE
RBC UA: NEGATIVE

## 2014-08-12 MED ORDER — GLYBURIDE 5 MG PO TABS: 5.0000 mg | ORAL_TABLET | Freq: Two times a day (BID) | ORAL | Status: DC

## 2014-08-12 NOTE — Progress Notes (Signed)
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 Brought log, almost all fasting >90, most 2hr >120. Trying to decrease carbs, still drinks soda 3-4x/wk. Will add glyburide  BID.  BP, weight, and urine reviewed.  Reports good fm. Denies regular uc's, lof, vb, uti s/s. No complaints.  BP elevated for 3rd time during pregnancy thus far, likely CHTN- will get baseline 24hr urine protein, and start baby asa daily, no meds needed at this time  Fetal Heart rate:  132 u/s Edema:  trace  Reviewed today's anatomy u/s- no obvious abnormalities although suboptimal views- unable to tell gender d/t position. Rodney Booze will try again before she leaves. Discussed ptl s/s, fm. To really cut back carbs, no sodas.  All questions were answered Assessment: [redacted]w[redacted]d, now A2DM, prob CHTN Medication(s) Plans:  Add glyburide  BID, baby asa daily Treatment Plan:  Growth u/s and repeat anatomy @ 28wks, begin 2x/wk testing @ 32wks Follow up in 2wks to check on sugars and bp, high-risk OB appt

## 2014-08-12 NOTE — Progress Notes (Signed)
U/S(20+3wks)-single active fetus, FHR-132 bpm, cx appears closed (3.3cm), bilateral adnexa appears WNL, anterior Gr 0 placenta, meas c/w dates, no obvious abnl noted  Although sub-optimal views of fetal anatomy, would like to reck anatomy @ ~28 weeks

## 2014-08-12 NOTE — Patient Instructions (Addendum)
Start taking a  baby aspirin daily  Hypertension During Pregnancy Hypertension, or high blood pressure, is when there is extra pressure inside your blood vessels that carry blood from the heart to the rest of your body (arteries). It can happen at any time in life, including pregnancy. Hypertension during pregnancy can cause problems for you and your baby. Your baby might not weigh as much as he or she should at birth or might be born early (premature). Very bad cases of hypertension during pregnancy can be life-threatening.  Different types of hypertension can occur during pregnancy. These include:  Chronic hypertension. This happens when a woman has hypertension before pregnancy and it continues during pregnancy.  Gestational hypertension. This is when hypertension develops during pregnancy.  Preeclampsia or toxemia of pregnancy. This is a very serious type of hypertension that develops only during pregnancy. It affects the whole body and can be very dangerous for both mother and baby.  Gestational hypertension and preeclampsia usually go away after your baby is born. Your blood pressure will likely stabilize within 6 weeks. Women who have hypertension during pregnancy have a greater chance of developing hypertension later in life or with future pregnancies. RISK FACTORS There are certain factors that make it more likely for you to develop hypertension during pregnancy. These include:  Having hypertension before pregnancy.  Having hypertension during a previous pregnancy.  Being overweight.  Being older than 40 years.  Being pregnant with more than one baby.  Having diabetes or kidney problems. SIGNS AND SYMPTOMS Chronic and gestational hypertension rarely cause symptoms. Preeclampsia has symptoms, which may include:  Increased protein in your urine. Your health care provider will check for this at every prenatal visit.  Swelling of your hands and face.  Rapid weight  gain.  Headaches.  Visual changes.  Being bothered by light.  Abdominal pain, especially in the upper right area.  Chest pain.  Shortness of breath.  Increased reflexes.  Seizures. These occur with a more severe form of preeclampsia, called eclampsia. DIAGNOSIS  You may be diagnosed with hypertension during a regular prenatal exam. At each prenatal visit, you may have:  Your blood pressure checked.  A urine test to check for protein in your urine. The type of hypertension you are diagnosed with depends on when you developed it. It also depends on your specific blood pressure reading.  Developing hypertension before 20 weeks of pregnancy is consistent with chronic hypertension.  Developing hypertension after 20 weeks of pregnancy is consistent with gestational hypertension.  Hypertension with increased urinary protein is diagnosed as preeclampsia.  Blood pressure measurements that stay above 160 systolic or 110 diastolic are a sign of severe preeclampsia. TREATMENT Treatment for hypertension during pregnancy varies. Treatment depends on the type of hypertension and how serious it is.  If you take medicine for chronic hypertension, you may need to switch medicines.  Medicines called ACE inhibitors should not be taken during pregnancy.  Low-dose aspirin may be suggested for women who have risk factors for preeclampsia.  If you have gestational hypertension, you may need to take a blood pressure medicine that is safe during pregnancy. Your health care provider will recommend the correct medicine.  If you have severe preeclampsia, you may need to be in the hospital. Health care providers will watch you and your baby very closely. You also may need to take medicine called magnesium sulfate to prevent seizures and lower blood pressure.  Sometimes, an early delivery is needed. This may be the  case if the condition worsens. It would be done to protect you and your baby. The only  cure for preeclampsia is delivery.  Your health care provider may recommend that you take one low-dose aspirin (81 mg) each day to help prevent high blood pressure during your pregnancy if you are at risk for preeclampsia. You may be at risk for preeclampsia if:  You had preeclampsia or eclampsia during a previous pregnancy.  Your baby did not grow as expected during a previous pregnancy.  You experienced preterm birth with a previous pregnancy.  You experienced a separation of the placenta from the uterus (placental abruption) during a previous pregnancy.  You experienced the loss of your baby during a previous pregnancy.  You are pregnant with more than one baby.  You have other medical conditions, such as diabetes or an autoimmune disease. HOME CARE INSTRUCTIONS  Schedule and keep all of your regular prenatal care appointments. This is important.  Take medicines only as directed by your health care provider. Tell your health care provider about all medicines you take.  Eat as little salt as possible.  Get regular exercise.  Do not drink alcohol.  Do not use tobacco products.  Do not drink products with caffeine.  Lie on your left side when resting. SEEK IMMEDIATE MEDICAL CARE IF:  You have severe abdominal pain.  You have sudden swelling in your hands, ankles, or face.  You gain 4 pounds (1.8 kg) or more in 1 week.  You vomit repeatedly.  You have vaginal bleeding.  You do not feel your baby moving as much.  You have a headache.  You have blurred or double vision.  You have muscle twitching or spasms.  You have shortness of breath.  You have blue fingernails or lips.  You have blood in your urine. MAKE SURE YOU:  Understand these instructions.  Will watch your condition.  Will get help right away if you are not doing well or get worse. Document Released: 08/09/2011 Document Revised: 04/07/2014 Document Reviewed: 06/20/2013 [facility] Patient  Information 2015 [place], [place]. This information is not intended to replace advice given to you by your health care provider. Make sure you discuss any questions you have with your health care provider.  Second Trimester of Pregnancy The second trimester is from week 13 through week 28, months 4 through 6. The second trimester is often a time when you feel your best. Your body has also adjusted to being pregnant, and you begin to feel better physically. Usually, morning sickness has lessened or quit completely, you may have more energy, and you may have an increase in appetite. The second trimester is also a time when the fetus is growing rapidly. At the end of the sixth month, the fetus is about 9 inches long and weighs about 1 pounds. You will likely begin to feel the baby move (quickening) between 18 and 20 weeks of the pregnancy. BODY CHANGES Your body goes through many changes during pregnancy. The changes vary from woman to woman.   Your weight will continue to increase. You will notice your lower abdomen bulging out.  You may begin to get stretch marks on your hips, abdomen, and breasts.  You may develop headaches that can be relieved by medicines approved by your health care provider.  You may urinate more often because the fetus is pressing on your bladder.  You may develop or continue to have heartburn as a result of your pregnancy.  You may develop constipation  because certain hormones are causing the muscles that push waste through your intestines to slow down.  You may develop hemorrhoids or swollen, bulging veins (varicose veins).  You may have back pain because of the weight gain and pregnancy hormones relaxing your joints between the bones in your pelvis and as a result of a shift in weight and the muscles that support your balance.  Your breasts will continue to grow and be tender.  Your gums may bleed and may be sensitive to brushing and flossing.  Dark spots or  blotches (chloasma, mask of pregnancy) may develop on your face. This will likely fade after the baby is born.  A dark line from your belly button to the pubic area (linea nigra) may appear. This will likely fade after the baby is born.  You may have changes in your hair. These can include thickening of your hair, rapid growth, and changes in texture. Some women also have hair loss during or after pregnancy, or hair that feels dry or thin. Your hair will most likely return to normal after your baby is born. WHAT TO EXPECT AT YOUR PRENATAL VISITS During a routine prenatal visit:  You will be weighed to make sure you and the fetus are growing normally.  Your blood pressure will be taken.  Your abdomen will be measured to track your baby's growth.  The fetal heartbeat will be listened to.  Any test results from the previous visit will be discussed. Your health care provider may ask you:  How you are feeling.  If you are feeling the baby move.  If you have had any abnormal symptoms, such as leaking fluid, bleeding, severe headaches, or abdominal cramping.  If you have any questions. Other tests that may be performed during your second trimester include:  Blood tests that check for:  Low iron levels (anemia).  Gestational diabetes (between 24 and 28 weeks).  Rh antibodies.  Urine tests to check for infections, diabetes, or protein in the urine.  An ultrasound to confirm the proper growth and development of the baby.  An amniocentesis to check for possible genetic problems.  Fetal screens for spina bifida and Down syndrome. HOME CARE INSTRUCTIONS   Avoid all smoking, herbs, alcohol, and unprescribed drugs. These chemicals affect the formation and growth of the baby.  Follow your health care provider's instructions regarding medicine use. There are medicines that are either safe or unsafe to take during pregnancy.  Exercise only as directed by your health care provider.  Experiencing uterine cramps is a good sign to stop exercising.  Continue to eat regular, healthy meals.  Wear a good support bra for breast tenderness.  Do not use hot tubs, steam rooms, or saunas.  Wear your seat belt at all times when driving.  Avoid raw meat, uncooked cheese, cat litter boxes, and soil used by cats. These carry germs that can cause birth defects in the baby.  Take your prenatal vitamins.  Try taking a stool softener (if your health care provider approves) if you develop constipation. Eat more high-fiber foods, such as fresh vegetables or fruit and whole grains. Drink plenty of fluids to keep your urine clear or pale yellow.  Take warm sitz baths to soothe any pain or discomfort caused by hemorrhoids. Use hemorrhoid cream if your health care provider approves.  If you develop varicose veins, wear support hose. Elevate your feet for 15 minutes, 3-4 times a day. Limit salt in your diet.  Avoid heavy lifting, wear  low heel shoes, and practice good posture.  Rest with your legs elevated if you have leg cramps or low back pain.  Visit your dentist if you have not gone yet during your pregnancy. Use a soft toothbrush to brush your teeth and be gentle when you floss.  A sexual relationship may be continued unless your health care provider directs you otherwise.  Continue to go to all your prenatal visits as directed by your health care provider. SEEK MEDICAL CARE IF:   You have dizziness.  You have mild pelvic cramps, pelvic pressure, or nagging pain in the abdominal area.  You have persistent nausea, vomiting, or diarrhea.  You have a bad smelling vaginal discharge.  You have pain with urination. SEEK IMMEDIATE MEDICAL CARE IF:   You have a fever.  You are leaking fluid from your vagina.  You have spotting or bleeding from your vagina.  You have severe abdominal cramping or pain.  You have rapid weight gain or loss.  You have shortness of breath with  chest pain.  You notice sudden or extreme swelling of your face, hands, ankles, feet, or legs.  You have not felt your baby move in over an hour.  You have severe headaches that do not go away with medicine.  You have vision changes. Document Released: 11/15/2001 Document Revised: 11/26/2013 Document Reviewed: 01/22/2013 [facility] Patient Information 2015 [place], [place]. This information is not intended to replace advice given to you by your health care provider. Make sure you discuss any questions you have with your health care provider.

## 2014-08-26 ENCOUNTER — Encounter: Payer: Self-pay | Admitting: Obstetrics and Gynecology

## 2014-08-26 ENCOUNTER — Ambulatory Visit (INDEPENDENT_AMBULATORY_CARE_PROVIDER_SITE_OTHER): Payer: Medicaid Other | Admitting: Obstetrics and Gynecology

## 2014-08-26 VITALS — BP 142/72 | Wt 356.0 lb

## 2014-08-26 DIAGNOSIS — O0992 Supervision of high risk pregnancy, unspecified, second trimester: Secondary | ICD-10-CM

## 2014-08-26 DIAGNOSIS — Z331 Pregnant state, incidental: Secondary | ICD-10-CM

## 2014-08-26 DIAGNOSIS — Z1389 Encounter for screening for other disorder: Secondary | ICD-10-CM

## 2014-08-26 DIAGNOSIS — O10019 Pre-existing essential hypertension complicating pregnancy, unspecified trimester: Secondary | ICD-10-CM

## 2014-08-26 DIAGNOSIS — O09899 Supervision of other high risk pregnancies, unspecified trimester: Secondary | ICD-10-CM

## 2014-08-26 DIAGNOSIS — O99810 Abnormal glucose complicating pregnancy: Secondary | ICD-10-CM

## 2014-08-26 LAB — POCT URINALYSIS DIPSTICK
Blood, UA: NEGATIVE
Glucose, UA: NEGATIVE
Ketones, UA: NEGATIVE
Leukocytes, UA: NEGATIVE
Nitrite, UA: NEGATIVE
Protein, UA: NEGATIVE

## 2014-08-26 NOTE — Progress Notes (Signed)
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  Pt has attended diabetic classes, was forcing self to eat. More than desired due to her intructions. I have advised small meals, NO forced eating.  G2P0010 [redacted]w[redacted]d Estimated Date of Delivery: 12/27/14  Blood pressure 142/72, weight 356 lb (161.481 kg), last menstrual period 01/11/2014.  Urinalysis: Negative   HPI: No c/o. No h/a, on baby ASA, no bp meds. BP weight and urine results all reviewed and noted.BP 142/72  Wt 356 lb (161.481 kg)  LMP 01/11/2014  Patient reports good fetal movement, denies any bleeding and no rupture of membranes symptoms or regular contractions.  Fundal Height:  C/s datwes Fetal Heart rate:  156 Edema:  Minimal on compression stockings  Patient is without complaints. All questions were answered.  Assessment:  [redacted]w[redacted]d,   CHTN, DMA1/B  Medication(s) Plans:  Glyburide no change, no bp meds yet  Treatment Plan:  No change  Follow up in 2 weeks for OB appt, bp chk

## 2014-08-26 NOTE — Progress Notes (Signed)
Pt denies any problems or concerns at this time.  

## 2014-09-08 ENCOUNTER — Other Ambulatory Visit: Payer: Self-pay | Admitting: Obstetrics & Gynecology

## 2014-09-09 ENCOUNTER — Ambulatory Visit (INDEPENDENT_AMBULATORY_CARE_PROVIDER_SITE_OTHER): Payer: Medicaid Other | Admitting: Advanced Practice Midwife

## 2014-09-09 ENCOUNTER — Encounter: Payer: Self-pay | Admitting: Advanced Practice Midwife

## 2014-09-09 VITALS — BP 122/78 | Wt 359.0 lb

## 2014-09-09 DIAGNOSIS — O0992 Supervision of high risk pregnancy, unspecified, second trimester: Secondary | ICD-10-CM

## 2014-09-09 DIAGNOSIS — O10012 Pre-existing essential hypertension complicating pregnancy, second trimester: Secondary | ICD-10-CM

## 2014-09-09 DIAGNOSIS — Z331 Pregnant state, incidental: Secondary | ICD-10-CM

## 2014-09-09 DIAGNOSIS — O10019 Pre-existing essential hypertension complicating pregnancy, unspecified trimester: Secondary | ICD-10-CM

## 2014-09-09 DIAGNOSIS — O24912 Unspecified diabetes mellitus in pregnancy, second trimester: Secondary | ICD-10-CM

## 2014-09-09 DIAGNOSIS — Z1389 Encounter for screening for other disorder: Secondary | ICD-10-CM

## 2014-09-09 LAB — POCT URINALYSIS DIPSTICK
Blood, UA: NEGATIVE
Glucose, UA: NEGATIVE
Ketones, UA: NEGATIVE
LEUKOCYTES UA: NEGATIVE
NITRITE UA: NEGATIVE
PROTEIN UA: NEGATIVE

## 2014-09-09 MED ORDER — GLUCOSE BLOOD VI STRP: ORAL_STRIP | Status: DC

## 2014-09-09 NOTE — Patient Instructions (Signed)
1. Before your test, do not eat or drink anything for 8-10 hours prior to your  appointment (a small amount of water is allowed and you may take any medicines you normally take). Be sure to drink lots of water the day before. 2. When you arrive, your blood will be drawn for a 'fasting' blood sugar level.  Then you will be given a sweetened carbonated beverage to drink. You should  complete drinking this beverage within five minutes. After finishing the  beverage, you will have your blood drawn exactly 1 and 2 hours later. Having  your blood drawn on time is an important part of this test. A total of three blood  samples will be done. 3. The test takes approximately 2  hours. During the test, do not have anything to  eat or drink. Do not smoke, chew gum (not even sugarless gum) or use breath mints.  4. During the test you should remain close by and seated as much as possible and  avoid walking around. You may want to bring a book or something else to  occupy your time.  5. After your test, you may eat and drink as normal. You may want to bring a snack  to eat after the test is finished. Your provider will advise you as to the results of  this test and any follow-up if necessary  You will also be retested for syphilis, HIV and blood levels (anemia):  You were already tested in the first trimester, but [place] recommends retesting.  Additionally, you will be tested for Type 2 Herpes. MOST people do not know that they have genital herpes, as only around 15% of people have outbreaks.  However, it is still transmittable to other people, including the baby (but only during the birth).  If you test positive for Type 2 Herpes, we place you on a medicine called acyclovir the last 6 weeks of your pregnancy to prevent transmission of the virus to the baby during the birth.    If your sugar test is positive for gestational diabetes, you will be given an phone call and further instructions discussed.   We typically do not call patients with positive herpes results, but will discuss it at your next appointment.  If you wish to know all of your test results before your next appointment, feel free to call the office, or look up your test results on Mychart.  (The range that the lab uses for normal values of the sugar test are not necessarily the range that is used for pregnant women; if your results are within the range, they are definitely normal.  However, if a value is deemed "high" by the lab, it may not be too high for a pregnant woman.  We will need to discuss the normal range if your value(s) fall in the "high" category).     Sometime between 27 and 36 weeks, it is recommended that you and anyone who is going to be in close contact with your baby receive the Tdap booster.  You should receive it EACH pregnancy, regardless of when your last booster was.  You may go to the Health Department (no appointment necessary) or your Primary Care office to receive the vaccine.  If you do not receive the vaccine prior to delivery, it will be offered in the hospital.  However, if you get it at least 2 weeks prior to delivery, you will have the added advantage of passing the immunity to your baby.   

## 2014-09-09 NOTE — Progress Notes (Signed)
Pt denies any problems or concerns at this time.  

## 2014-09-09 NOTE — Progress Notes (Signed)
High Risk Pregnancy Diagnosis(es):   CHTN A2/B DM  G2P0010 2982w3d Estimated Date of Delivery: 12/27/14  Blood pressure 122/78, weight 359 lb (162.841 kg), last menstrual period 01/11/2014.  Urinalysis: Negative   HPI: Has been having difficult time with eating right because her brother in law has been with her and they have been eating out a lot.  He is moving out today.     BP weight and urine results all reviewed and noted. Patient reports good fetal movement, denies any bleeding and no rupture of membranes symptoms or regular contractions.  Fundal Height:  27 Fetal Heart rate:  148 Edema:  Trace Blood sugars:  Most FBS <95, has several pp 200, usually 110-60 (due to poor eating habits).  Stressed importance of maintaining good glycemic control  Patient is without complaints. All questions were answered.  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 Pt cautioned to call us if they are not for medicaiton changes.  Continue ASA 81mg .   Treatment Plan:  Recheck anatomy next month;  Follow up in 4 weeks for OB appt, [place]

## 2014-09-22 ENCOUNTER — Telehealth: Payer: Self-pay | Admitting: Women's Health

## 2014-09-22 DIAGNOSIS — O24912 Unspecified diabetes mellitus in pregnancy, second trimester: Secondary | ICD-10-CM

## 2014-09-22 NOTE — Telephone Encounter (Signed)
Pt states that she was told to call if her blood sugars were staying elevated and she would need her Glyburide adjusted.  She states that her morning fasting blood sugars are staying above 100, usually ranging between 103 and 115 and her afternoon readings are usually in the 130's or 140's.  She is currently on Glyburide 5mg  BID.  Please advise.

## 2014-09-23 NOTE — Telephone Encounter (Signed)
Informed pt to take 10 mg twice daily and call us back when she starts getting low of medication and we will send in new RX for the correct dose.  Her next follow up is in two weeks (11/03) and she just refilled the 5mg  RX so she might have enough to get her to next visit but if not she will call us for the RX to be sent to her pharmacy.

## 2014-10-06 ENCOUNTER — Encounter: Payer: Self-pay | Admitting: Advanced Practice Midwife

## 2014-10-06 ENCOUNTER — Other Ambulatory Visit: Payer: Self-pay | Admitting: Obstetrics and Gynecology

## 2014-10-06 DIAGNOSIS — O09292 Supervision of pregnancy with other poor reproductive or obstetric history, second trimester: Secondary | ICD-10-CM

## 2014-10-06 DIAGNOSIS — R939 Diagnostic imaging inconclusive due to excess body fat of patient: Secondary | ICD-10-CM

## 2014-10-07 ENCOUNTER — Encounter: Payer: Self-pay | Admitting: Advanced Practice Midwife

## 2014-10-07 ENCOUNTER — Ambulatory Visit (INDEPENDENT_AMBULATORY_CARE_PROVIDER_SITE_OTHER): Payer: Medicaid Other

## 2014-10-07 ENCOUNTER — Ambulatory Visit (INDEPENDENT_AMBULATORY_CARE_PROVIDER_SITE_OTHER): Payer: Medicaid Other | Admitting: Advanced Practice Midwife

## 2014-10-07 VITALS — BP 138/88 | Wt 357.5 lb

## 2014-10-07 DIAGNOSIS — O09292 Supervision of pregnancy with other poor reproductive or obstetric history, second trimester: Secondary | ICD-10-CM

## 2014-10-07 DIAGNOSIS — Z1389 Encounter for screening for other disorder: Secondary | ICD-10-CM

## 2014-10-07 DIAGNOSIS — Z3483 Encounter for supervision of other normal pregnancy, third trimester: Secondary | ICD-10-CM

## 2014-10-07 DIAGNOSIS — Z114 Encounter for screening for human immunodeficiency virus [HIV]: Secondary | ICD-10-CM

## 2014-10-07 DIAGNOSIS — Z0184 Encounter for antibody response examination: Secondary | ICD-10-CM

## 2014-10-07 DIAGNOSIS — Z113 Encounter for screening for infections with a predominantly sexual mode of transmission: Secondary | ICD-10-CM

## 2014-10-07 DIAGNOSIS — O0993 Supervision of high risk pregnancy, unspecified, third trimester: Secondary | ICD-10-CM

## 2014-10-07 DIAGNOSIS — Z331 Pregnant state, incidental: Secondary | ICD-10-CM

## 2014-10-07 DIAGNOSIS — R939 Diagnostic imaging inconclusive due to excess body fat of patient: Secondary | ICD-10-CM

## 2014-10-07 DIAGNOSIS — O24913 Unspecified diabetes mellitus in pregnancy, third trimester: Secondary | ICD-10-CM

## 2014-10-07 DIAGNOSIS — O10019 Pre-existing essential hypertension complicating pregnancy, unspecified trimester: Secondary | ICD-10-CM

## 2014-10-07 LAB — POCT URINALYSIS DIPSTICK
Glucose, UA: NEGATIVE
Ketones, UA: NEGATIVE
Leukocytes, UA: NEGATIVE
NITRITE UA: NEGATIVE
Protein, UA: NEGATIVE
RBC UA: NEGATIVE

## 2014-10-07 LAB — CBC
HCT: 33.8 % — ABNORMAL LOW (ref 36.0–46.0)
Hemoglobin: 11.5 g/dL — ABNORMAL LOW (ref 12.0–15.0)
MCH: 30.7 pg (ref 26.0–34.0)
MCHC: 34 g/dL (ref 30.0–36.0)
MCV: 90.4 fL (ref 78.0–100.0)
PLATELETS: 509 10*3/uL — AB (ref 150–400)
RBC: 3.74 MIL/uL — ABNORMAL LOW (ref 3.87–5.11)
RDW: 13.8 % (ref 11.5–15.5)
WBC: 15.9 10*3/uL — ABNORMAL HIGH (ref 4.0–10.5)

## 2014-10-07 MED ORDER — GLUCOSE BLOOD VI STRP: ORAL_STRIP | Status: DC

## 2014-10-07 MED ORDER — GLYBURIDE 5 MG PO TABS: 10.0000 mg | ORAL_TABLET | Freq: Two times a day (BID) | ORAL | Status: DC

## 2014-10-07 NOTE — Progress Notes (Signed)
Pt states that she feels tingling in her first 3 fingers on her right hand, and has been constant for 2 days.

## 2014-10-07 NOTE — Progress Notes (Signed)
High Risk Pregnancy Diagnosis(es):   A2/B DM, CHTN  G2P0010 6071w3d Estimated Date of Delivery: 12/27/14  Blood pressure 138/88, weight 357 lb 8 oz (162.161 kg), last menstrual period 01/11/2014.  Urinalysis: negataive   HPI:    Had BPP/EFW:  All normal.  Has not turned in a 24 hr urine for protein yet  BP weight and urine results all reviewed and noted. Patient reports good fetal movement, denies any bleeding and no rupture of membranes symptoms or regular contractions.  Fundal Height:  EFW 68% Fetal Heart rate:  148 Edema:  trace  Patient is without complaints. All questions were answered.  Assessment:  5371w3d,   CHTN, no meds.  A2/B DM, well controlled  Medication(s) Plans:  Continue Glyburide 10 mg BID  Treatment Plan:  US 34,38 weeks.  NST 2x/week at 32 weeks.  Consider weekly us d/t CHTN, but no meds.  24 hour urine for TP; PN2 labs today  Follow up in 2 weeks for OB appt,

## 2014-10-07 NOTE — Progress Notes (Signed)
U/S(28+3wks)- breech active fetus, FHR-148 bpm, fluid WNL SDP-6.7cm, EFW 3 lb 1 oz (68th%tile), anterior Gr 1 placenta, female fetus, no obvious abnl noted

## 2014-10-08 LAB — ANTIBODY SCREEN: Antibody Screen: NEGATIVE

## 2014-10-08 LAB — RPR

## 2014-10-08 LAB — HSV 2 ANTIBODY, IGG

## 2014-10-08 LAB — HIV ANTIBODY (ROUTINE TESTING W REFLEX): HIV: NONREACTIVE

## 2014-10-13 ENCOUNTER — Other Ambulatory Visit: Payer: Self-pay | Admitting: Advanced Practice Midwife

## 2014-10-13 ENCOUNTER — Other Ambulatory Visit: Payer: Medicaid Other

## 2014-10-13 DIAGNOSIS — O10019 Pre-existing essential hypertension complicating pregnancy, unspecified trimester: Secondary | ICD-10-CM

## 2014-10-13 DIAGNOSIS — O0993 Supervision of high risk pregnancy, unspecified, third trimester: Secondary | ICD-10-CM

## 2014-10-13 NOTE — Addendum Note (Signed)
Addended by: Richardson ChiquitoRAVIS, Khairi Garman M on: 10/13/2014 03:49 PM   Modules accepted: Orders

## 2014-10-16 LAB — PROTEIN, URINE, 24 HOUR
PROTEIN, URINE: 21 mg/dL (ref 5–24)
Protein, 24H Urine: 210 mg/d — ABNORMAL HIGH (ref <150)

## 2014-10-21 ENCOUNTER — Ambulatory Visit (INDEPENDENT_AMBULATORY_CARE_PROVIDER_SITE_OTHER): Payer: Medicaid Other | Admitting: Women's Health

## 2014-10-21 VITALS — BP 128/72 | Wt 361.0 lb

## 2014-10-21 DIAGNOSIS — N9489 Other specified conditions associated with female genital organs and menstrual cycle: Secondary | ICD-10-CM

## 2014-10-21 DIAGNOSIS — Z331 Pregnant state, incidental: Secondary | ICD-10-CM

## 2014-10-21 DIAGNOSIS — Z1389 Encounter for screening for other disorder: Secondary | ICD-10-CM

## 2014-10-21 DIAGNOSIS — O10019 Pre-existing essential hypertension complicating pregnancy, unspecified trimester: Secondary | ICD-10-CM

## 2014-10-21 DIAGNOSIS — O09893 Supervision of other high risk pregnancies, third trimester: Secondary | ICD-10-CM

## 2014-10-21 DIAGNOSIS — O24913 Unspecified diabetes mellitus in pregnancy, third trimester: Secondary | ICD-10-CM

## 2014-10-21 DIAGNOSIS — N898 Other specified noninflammatory disorders of vagina: Secondary | ICD-10-CM

## 2014-10-21 LAB — POCT URINALYSIS DIPSTICK
Blood, UA: NEGATIVE
Glucose, UA: NEGATIVE
Ketones, UA: NEGATIVE
Leukocytes, UA: NEGATIVE
Nitrite, UA: NEGATIVE
Protein, UA: NEGATIVE

## 2014-10-21 LAB — POCT WET PREP (WET MOUNT): CLUE CELLS WET PREP WHIFF POC: NEGATIVE

## 2014-10-21 NOTE — Patient Instructions (Addendum)
Call the office [phone]) or go to [facility] if:  You begin to have strong, frequent contractions  Your water breaks.  Sometimes it is a big gush of fluid, sometimes it is just a trickle that keeps getting your panties wet or running down your legs  You have vaginal bleeding.  It is normal to have a small amount of spotting if your cervix was checked.  You don't feel your baby moving like normal.  If you don't, get you something to eat and drink and lay down and focus on feeling your baby move.  You should feel at least 10 movements in 2 hours.  If you don't, you should call the office or go to [facility].    Tips to Help You Sleep Better:   Get into a bedtime routine, try to do the same thing every night before going to bed to try to help your body wind down  Warm baths  Avoid caffeine for at least 3 hours before going to sleep   Keep your room at a slightly cooler temperature, can try running a fan  Turn off TV, lights, phone, electronics  Lots of pillows if needed to help you get comfortable  Lavender scented items can help you sleep. You can place lavender essential oil on a cotton ball and place under your pillowcase, or place in a diffuser. [employer] has a lavender scented sleep line (plug-ins, sprays, etc). Look in the pillow aisle for lavender scented pillows.   If none of the above things help, you can try 1/2 of a benadryl, unisom, or tylenol pm. Do not take this every night, only when you really need it.    Third Trimester of Pregnancy The third trimester is from week 29 through week 42, months 7 through 9. The third trimester is a time when the fetus is growing rapidly. At the end of the ninth month, the fetus is about 20 inches in length and weighs 6-10 pounds.  BODY CHANGES Your body goes through many changes during pregnancy. The changes vary from woman to woman.  12. Your weight will continue to increase. You can expect to gain 25-35 pounds (11-16 kg) by  the end of the pregnancy. 13. You may begin to get stretch marks on your hips, abdomen, and breasts. 14. You may urinate more often because the fetus is moving lower into your pelvis and pressing on your bladder. 15. You may develop or continue to have heartburn as a result of your pregnancy. 16. You may develop constipation because certain hormones are causing the muscles that push waste through your intestines to slow down. 17. You may develop hemorrhoids or swollen, bulging veins (varicose veins). 18. You may have pelvic pain because of the weight gain and pregnancy hormones relaxing your joints between the bones in your pelvis. Backaches may result from overexertion of the muscles supporting your posture. 19. You may have changes in your hair. These can include thickening of your hair, rapid growth, and changes in texture. Some women also have hair loss during or after pregnancy, or hair that feels dry or thin. Your hair will most likely return to normal after your baby is born. 20. Your breasts will continue to grow and be tender. A yellow discharge may leak from your breasts called colostrum. 21. Your belly button may stick out. 22. You may feel short of breath because of your expanding uterus. 23. You may notice the fetus "dropping," or moving lower in your abdomen. 24.  You may have a bloody mucus discharge. This usually occurs a few days to a week before labor begins. 25. Your cervix becomes thin and soft (effaced) near your due date. WHAT TO EXPECT AT YOUR PRENATAL EXAMS  You will have prenatal exams every 2 weeks until week 36. Then, you will have weekly prenatal exams. During a routine prenatal visit:  You will be weighed to make sure you and the fetus are growing normally.  Your blood pressure is taken.  Your abdomen will be measured to track your baby's growth.  The fetal heartbeat will be listened to.  Any test results from the previous visit will be discussed.  You may have  a cervical check near your due date to see if you have effaced. At around 36 weeks, your caregiver will check your cervix. At the same time, your caregiver will also perform a test on the secretions of the vaginal tissue. This test is to determine if a type of bacteria, Group B streptococcus, is present. Your caregiver will explain this further. Your caregiver may ask you:  What your birth plan is.  How you are feeling.  If you are feeling the baby move.  If you have had any abnormal symptoms, such as leaking fluid, bleeding, severe headaches, or abdominal cramping.  If you have any questions. Other tests or screenings that may be performed during your third trimester include:  Blood tests that check for low iron levels (anemia).  Fetal testing to check the health, activity level, and growth of the fetus. Testing is done if you have certain medical conditions or if there are problems during the pregnancy. FALSE LABOR You may feel small, irregular contractions that eventually go away. These are called Braxton Hicks contractions, or false labor. Contractions may last for hours, days, or even weeks before true labor sets in. If contractions come at regular intervals, intensify, or become painful, it is best to be seen by your caregiver.  SIGNS OF LABOR   Menstrual-like cramps.  Contractions that are 5 minutes apart or less.  Contractions that start on the top of the uterus and spread down to the lower abdomen and back.  A sense of increased pelvic pressure or back pain.  A watery or bloody mucus discharge that comes from the vagina. If you have any of these signs before the 37th week of pregnancy, call your caregiver right away. You need to go to the hospital to get checked immediately. HOME CARE INSTRUCTIONS   Avoid all smoking, herbs, alcohol, and unprescribed drugs. These chemicals affect the formation and growth of the baby.  Follow your caregiver's instructions regarding  medicine use. There are medicines that are either safe or unsafe to take during pregnancy.  Exercise only as directed by your caregiver. Experiencing uterine cramps is a good sign to stop exercising.  Continue to eat regular, healthy meals.  Wear a good support bra for breast tenderness.  Do not use hot tubs, steam rooms, or saunas.  Wear your seat belt at all times when driving.  Avoid raw meat, uncooked cheese, cat litter boxes, and soil used by cats. These carry germs that can cause birth defects in the baby.  Take your prenatal vitamins.  Try taking a stool softener (if your caregiver approves) if you develop constipation. Eat more high-fiber foods, such as fresh vegetables or fruit and whole grains. Drink plenty of fluids to keep your urine clear or pale yellow.  Take warm sitz baths to soothe any pain  or discomfort caused by hemorrhoids. Use hemorrhoid cream if your caregiver approves.  If you develop varicose veins, wear support hose. Elevate your feet for 15 minutes, 3-4 times a day. Limit salt in your diet.  Avoid heavy lifting, wear low heal shoes, and practice good posture.  Rest a lot with your legs elevated if you have leg cramps or low back pain.  Visit your dentist if you have not gone during your pregnancy. Use a soft toothbrush to brush your teeth and be gentle when you floss.  A sexual relationship may be continued unless your caregiver directs you otherwise.  Do not travel far distances unless it is absolutely necessary and only with the approval of your caregiver.  Take prenatal classes to understand, practice, and ask questions about the labor and delivery.  Make a trial run to the hospital.  Pack your hospital bag.  Prepare the baby's nursery.  Continue to go to all your prenatal visits as directed by your caregiver. SEEK MEDICAL CARE IF:  You are unsure if you are in labor or if your water has broken.  You have dizziness.  You have mild pelvic  cramps, pelvic pressure, or nagging pain in your abdominal area.  You have persistent nausea, vomiting, or diarrhea.  You have a bad smelling vaginal discharge.  You have pain with urination. SEEK IMMEDIATE MEDICAL CARE IF:   You have a fever.  You are leaking fluid from your vagina.  You have spotting or bleeding from your vagina.  You have severe abdominal cramping or pain.  You have rapid weight loss or gain.  You have shortness of breath with chest pain.  You notice sudden or extreme swelling of your face, hands, ankles, feet, or legs.  You have not felt your baby move in over an hour.  You have severe headaches that do not go away with medicine.  You have vision changes. Document Released: 11/15/2001 Document Revised: 11/26/2013 Document Reviewed: 01/22/2013 [facility] Patient Information 2015 [place], [place]. This information is not intended to replace advice given to you by your health care provider. Make sure you discuss any questions you have with your health care provider.

## 2014-10-21 NOTE — Progress Notes (Signed)
High Risk Pregnancy Diagnosis(es): CHTN (no meds), A2DM G2P0010 5768w3d Estimated Date of Delivery: 12/27/14 BP 128/72 mmHg  Wt 361 lb (163.749 kg)  LMP 01/11/2014  Urinalysis: Negative HPI:  cbg's doing good, all fbs <90, only 1 pp >120- left glucometer at friends house out of town this week, they are mailing it back.  BP, weight, and urine reviewed.  Reports good fm. Denies regular uc's, lof, vb, uti s/s. Cold x 1 week. Occ dozing off on 1hr drive to work- to check on cutting back hours. Gave info on things to try to help sleep better at night.   Fundal Height:  44 Fetal Heart rate:  168 Edema:  1+  Reviewed ptl s/s, fkc All questions were answered Assessment: 7868w3d CHTN (no meds), A2DM Medication(s) Plans:  Continue glyburide 10mg  BID Treatment Plan:  Begin 2x/wk testing at next visit, nst/sono Follow up in 2wks for high-risk OB appt and bpp u/s Call if cold sx not improving after 10d or worsening

## 2014-10-27 ENCOUNTER — Telehealth: Payer: Self-pay | Admitting: Women's Health

## 2014-10-27 MED ORDER — BUTALBITAL-APAP-CAFFEINE 50-325-40 MG PO TABS: 1.0000 | ORAL_TABLET | ORAL | Status: DC | PRN

## 2014-10-27 NOTE — Telephone Encounter (Signed)
Pt states has a migraine today not relieved with tylenol. Pt states is there something else she can take?

## 2014-10-27 NOTE — Telephone Encounter (Signed)
Pt informed Fioricet at front desk for pick up. Pt informed to call office back if no improvement with medication. Pt verbalized understanding.

## 2014-11-04 ENCOUNTER — Ambulatory Visit (INDEPENDENT_AMBULATORY_CARE_PROVIDER_SITE_OTHER): Payer: Medicaid Other | Admitting: Obstetrics & Gynecology

## 2014-11-04 ENCOUNTER — Ambulatory Visit (INDEPENDENT_AMBULATORY_CARE_PROVIDER_SITE_OTHER): Payer: Medicaid Other

## 2014-11-04 ENCOUNTER — Encounter: Payer: Self-pay | Admitting: Obstetrics & Gynecology

## 2014-11-04 ENCOUNTER — Other Ambulatory Visit: Payer: Self-pay | Admitting: Women's Health

## 2014-11-04 VITALS — BP 124/82 | Wt 363.0 lb

## 2014-11-04 DIAGNOSIS — O10019 Pre-existing essential hypertension complicating pregnancy, unspecified trimester: Secondary | ICD-10-CM

## 2014-11-04 DIAGNOSIS — O09893 Supervision of other high risk pregnancies, third trimester: Secondary | ICD-10-CM

## 2014-11-04 DIAGNOSIS — O24913 Unspecified diabetes mellitus in pregnancy, third trimester: Secondary | ICD-10-CM

## 2014-11-04 DIAGNOSIS — Z1389 Encounter for screening for other disorder: Secondary | ICD-10-CM

## 2014-11-04 DIAGNOSIS — Z331 Pregnant state, incidental: Secondary | ICD-10-CM

## 2014-11-04 LAB — POCT URINALYSIS DIPSTICK
Glucose, UA: NEGATIVE
KETONES UA: NEGATIVE
LEUKOCYTES UA: NEGATIVE
Nitrite, UA: NEGATIVE
Protein, UA: NEGATIVE
RBC UA: NEGATIVE

## 2014-11-04 LAB — US OB FOLLOW UP

## 2014-11-04 NOTE — Progress Notes (Signed)
U/S(32+3wks)-frank breech active fetus BPP 8/8, fluid WNL AFI-13.9cm SDP-6.4cm, anterior Gr 2 placenta, EFW 5 lb 1 oz (68th%tile), UA Doppler RI-0.56 & 0.54, FHR- 138 bpm, female fetus

## 2014-11-04 NOTE — Progress Notes (Signed)
Pt denies any problems or concerns at this time.  

## 2014-11-04 NOTE — Progress Notes (Signed)
Sonogram is read and report done, all normal   High Risk Pregnancy Diagnosis(es):   Chronic hypertension, Class A2/B diabetes  G2P0010 1974w3d Estimated Date of Delivery: 12/27/14  Blood pressure 124/82, weight 363 lb (164.656 kg), last menstrual period 01/11/2014.  Urinalysis: Negative   HPI: Has not been doing blood sugars     BP weight and urine results all reviewed and noted. Patient reports good fetal movement, denies any bleeding and no rupture of membranes symptoms or regular contractions.  Fundal Height:  44 Fetal Heart rate:  150 Edema:  none  Patient is without complaints. All questions were answered.  Assessment:  8874w3d,   Chronic hypertension, Class A2/DM  Medication(s) Plans:  No changes glyburide 10 bid,  Treatment Plan:  Twice weekly testing, NST alternating with sonograms  Follow up in Thursday weeks for OB appt, NST

## 2014-11-07 ENCOUNTER — Ambulatory Visit (INDEPENDENT_AMBULATORY_CARE_PROVIDER_SITE_OTHER): Payer: Medicaid Other | Admitting: Obstetrics & Gynecology

## 2014-11-07 VITALS — BP 148/98 | Wt 365.0 lb

## 2014-11-07 DIAGNOSIS — O24913 Unspecified diabetes mellitus in pregnancy, third trimester: Secondary | ICD-10-CM

## 2014-11-07 DIAGNOSIS — O10019 Pre-existing essential hypertension complicating pregnancy, unspecified trimester: Secondary | ICD-10-CM

## 2014-11-07 DIAGNOSIS — O09893 Supervision of other high risk pregnancies, third trimester: Secondary | ICD-10-CM

## 2014-11-07 DIAGNOSIS — Z331 Pregnant state, incidental: Secondary | ICD-10-CM

## 2014-11-07 DIAGNOSIS — Z1389 Encounter for screening for other disorder: Secondary | ICD-10-CM

## 2014-11-07 LAB — POCT URINALYSIS DIPSTICK
Blood, UA: NEGATIVE
GLUCOSE UA: NEGATIVE
Leukocytes, UA: NEGATIVE
NITRITE UA: NEGATIVE
Protein, UA: NEGATIVE

## 2014-11-07 MED ORDER — LABETALOL HCL 200 MG PO TABS: 200.0000 mg | ORAL_TABLET | Freq: Two times a day (BID) | ORAL | Status: DC

## 2014-11-07 MED ORDER — METFORMIN HCL 500 MG PO TABS: 500.0000 mg | ORAL_TABLET | Freq: Two times a day (BID) | ORAL | Status: DC

## 2014-11-07 NOTE — Progress Notes (Signed)
Reactive NST  High Risk Pregnancy Diagnosis(es):   Class A2/B diabetes, CHTN  G2P0010 1539w6d Estimated Date of Delivery: 12/27/14  Blood pressure 148/98, weight 365 lb (165.563 kg), last menstrual period 01/11/2014.  Urinalysis: Negative   HPI: CBG are too high, add metformin to the glyburide No complaints     BP weight and urine results all reviewed and noted. Patient reports good fetal movement, denies any bleeding and no rupture of membranes symptoms or regular contractions.  Fundal Height:  44 Fetal Heart rate:  145 Edema:  1+  Patient is without complaints. All questions were answered.  Assessment:  3539w6d,   CHTN, Class A2/B diabetes  Medication(s) Plans:  Add labetalol 200 BID, add metformin 500 BID  Treatment Plan:  Twice weekly surveillance, sonogram and NST alternating  Follow up in tuesday weeks for OB appt, NST

## 2014-11-11 ENCOUNTER — Encounter: Payer: Self-pay | Admitting: Obstetrics & Gynecology

## 2014-11-11 ENCOUNTER — Other Ambulatory Visit: Payer: Medicaid Other | Admitting: Obstetrics & Gynecology

## 2014-11-11 ENCOUNTER — Ambulatory Visit (INDEPENDENT_AMBULATORY_CARE_PROVIDER_SITE_OTHER): Payer: Medicaid Other | Admitting: Obstetrics & Gynecology

## 2014-11-11 VITALS — BP 140/80 | Wt 366.8 lb

## 2014-11-11 DIAGNOSIS — Z331 Pregnant state, incidental: Secondary | ICD-10-CM

## 2014-11-11 DIAGNOSIS — O24913 Unspecified diabetes mellitus in pregnancy, third trimester: Secondary | ICD-10-CM

## 2014-11-11 DIAGNOSIS — Z1389 Encounter for screening for other disorder: Secondary | ICD-10-CM

## 2014-11-11 DIAGNOSIS — O10019 Pre-existing essential hypertension complicating pregnancy, unspecified trimester: Secondary | ICD-10-CM

## 2014-11-11 DIAGNOSIS — O09893 Supervision of other high risk pregnancies, third trimester: Secondary | ICD-10-CM

## 2014-11-11 LAB — POCT URINALYSIS DIPSTICK
Blood, UA: NEGATIVE
Glucose, UA: NEGATIVE
Ketones, UA: NEGATIVE
NITRITE UA: NEGATIVE
PROTEIN UA: NEGATIVE

## 2014-11-11 MED ORDER — METFORMIN HCL 500 MG PO TABS: 1000.0000 mg | ORAL_TABLET | Freq: Two times a day (BID) | ORAL | Status: DC

## 2014-11-11 NOTE — Progress Notes (Signed)
Reactive NST  High Risk Pregnancy Diagnosis(es):   Class A2/B DM and chronic hypertesnion  G2P0010 5753w3d Estimated Date of Delivery: 12/27/14  Blood pressure 140/80, weight 366 lb 12.8 oz (166.379 kg), last menstrual period 01/11/2014.  Urinalysis: Negative   HPI: CBG are sub optimal, increase metformin to 1000 mg BID     BP weight and urine results all reviewed and noted. Patient reports good fetal movement, denies any bleeding and no rupture of membranes symptoms or regular contractions.  Fundal Height:  46 Fetal Heart rate:  145 Edema:  none  Patient is without complaints. All questions were answered.  Assessment:  5653w3d,   Class A2/B DM, chronic hypertension  Medication(s) Plans:  Glyburide 10 BID, metformin 1000 BID, labeatlol 200 BID  Treatment Plan:  Twice weekly testing  Follow up in friday weeks for OB appt, sonogram

## 2014-11-14 ENCOUNTER — Other Ambulatory Visit: Payer: Self-pay | Admitting: Obstetrics & Gynecology

## 2014-11-14 ENCOUNTER — Ambulatory Visit (INDEPENDENT_AMBULATORY_CARE_PROVIDER_SITE_OTHER): Payer: Medicaid Other

## 2014-11-14 ENCOUNTER — Encounter: Payer: Self-pay | Admitting: Obstetrics & Gynecology

## 2014-11-14 ENCOUNTER — Ambulatory Visit (INDEPENDENT_AMBULATORY_CARE_PROVIDER_SITE_OTHER): Payer: Medicaid Other | Admitting: Obstetrics & Gynecology

## 2014-11-14 VITALS — BP 120/80 | Wt 372.0 lb

## 2014-11-14 DIAGNOSIS — O24913 Unspecified diabetes mellitus in pregnancy, third trimester: Secondary | ICD-10-CM

## 2014-11-14 DIAGNOSIS — O10019 Pre-existing essential hypertension complicating pregnancy, unspecified trimester: Secondary | ICD-10-CM

## 2014-11-14 DIAGNOSIS — Z1389 Encounter for screening for other disorder: Secondary | ICD-10-CM

## 2014-11-14 DIAGNOSIS — Z331 Pregnant state, incidental: Secondary | ICD-10-CM

## 2014-11-14 DIAGNOSIS — O09893 Supervision of other high risk pregnancies, third trimester: Secondary | ICD-10-CM

## 2014-11-14 LAB — POCT URINALYSIS DIPSTICK
Blood, UA: NEGATIVE
Glucose, UA: NEGATIVE
KETONES UA: NEGATIVE
Leukocytes, UA: NEGATIVE
Nitrite, UA: NEGATIVE

## 2014-11-14 LAB — US OB FOLLOW UP

## 2014-11-14 NOTE — Progress Notes (Signed)
Sonogram: Reviewed read and report done:  Reassuring surveillance   High Risk Pregnancy Diagnosis(es):   Class A2/B DM, Chronic Hypertension  G2P0010 1929w6d Estimated Date of Delivery: 12/27/14  Blood pressure 120/80, weight 372 lb (168.738 kg), last menstrual period 01/11/2014.  Urinalysis: Negative   HPI: CBG much better,  Not perfect but better, may have to add lantus but not yet   BP weight and urine results all reviewed and noted. Patient reports good fetal movement, denies any bleeding and no rupture of membranes symptoms or regular contractions.  Fundal Height:  46 Fetal Heart rate:  151 Edema:  none  Patient is without complaints. All questions were answered.  Assessment:  5629w6d,   Class A2/B Diabetes, chronic hypertension  Medication(s) Plans:  Glyburide 10 BID, Metformin 1000 BID  Treatment Plan:  NST x 2 nextweek in anticipation of Christmas  Follow up in Tuesday weeks for OB appt, NST

## 2014-11-14 NOTE — Progress Notes (Signed)
U/S(33+6wks)-Frank Breech, EFW 6 lb 73rd%tile, fluid WNL AFI-13.1cm SDP-5.7cm, BPP 8/8, FHR-137 bpm, anterior gr 1 placenta, UA Doppler RI-0.67 & 0.54

## 2014-11-18 ENCOUNTER — Ambulatory Visit (INDEPENDENT_AMBULATORY_CARE_PROVIDER_SITE_OTHER): Payer: Medicaid Other | Admitting: Obstetrics & Gynecology

## 2014-11-18 VITALS — BP 120/80 | Wt 374.8 lb

## 2014-11-18 DIAGNOSIS — Z331 Pregnant state, incidental: Secondary | ICD-10-CM

## 2014-11-18 DIAGNOSIS — O24913 Unspecified diabetes mellitus in pregnancy, third trimester: Secondary | ICD-10-CM

## 2014-11-18 DIAGNOSIS — Z1389 Encounter for screening for other disorder: Secondary | ICD-10-CM

## 2014-11-18 DIAGNOSIS — O10019 Pre-existing essential hypertension complicating pregnancy, unspecified trimester: Secondary | ICD-10-CM

## 2014-11-18 DIAGNOSIS — O0993 Supervision of high risk pregnancy, unspecified, third trimester: Secondary | ICD-10-CM

## 2014-11-18 LAB — POCT URINALYSIS DIPSTICK
Blood, UA: NEGATIVE
GLUCOSE UA: NEGATIVE
KETONES UA: NEGATIVE
LEUKOCYTES UA: NEGATIVE
Nitrite, UA: NEGATIVE

## 2014-11-18 NOTE — Progress Notes (Signed)
Reactive NST   High Risk Pregnancy Diagnosis(es):   Clas A2/B diabetes, chronic hypertension  G2P0010 3667w3d Estimated Date of Delivery: 12/27/14  Blood pressure 120/80, weight 374 lb 12.8 oz (170.008 kg), last menstrual period 01/11/2014.  Urinalysis: Negative   HPI: CBG much better on glyburide 10 BID + metformin 1000 BID, BP stable on labetalol 200 BID     BP weight and urine results all reviewed and noted. Patient reports good fetal movement, denies any bleeding and no rupture of membranes symptoms or regular contractions.  Fundal Height:  46 Fetal Heart rate:  145 Edema:  trace  Patient is without complaints. All questions were answered.  Assessment:  4167w3d,   Class A2/B DM, CHTN  Medication(s) Plans:  As above no changes  Treatment Plan:  Twice weekly surveillance  Follow up in NST weeks for OB appt, Friday

## 2014-11-20 ENCOUNTER — Other Ambulatory Visit: Payer: Self-pay | Admitting: Obstetrics & Gynecology

## 2014-11-20 DIAGNOSIS — O163 Unspecified maternal hypertension, third trimester: Secondary | ICD-10-CM

## 2014-11-20 DIAGNOSIS — O09293 Supervision of pregnancy with other poor reproductive or obstetric history, third trimester: Secondary | ICD-10-CM

## 2014-11-20 DIAGNOSIS — O24419 Gestational diabetes mellitus in pregnancy, unspecified control: Secondary | ICD-10-CM

## 2014-11-21 ENCOUNTER — Encounter: Payer: Self-pay | Admitting: Obstetrics & Gynecology

## 2014-11-21 ENCOUNTER — Ambulatory Visit (INDEPENDENT_AMBULATORY_CARE_PROVIDER_SITE_OTHER): Payer: Medicaid Other | Admitting: Obstetrics & Gynecology

## 2014-11-21 VITALS — BP 134/92 | Wt 374.4 lb

## 2014-11-21 DIAGNOSIS — O24913 Unspecified diabetes mellitus in pregnancy, third trimester: Secondary | ICD-10-CM

## 2014-11-21 DIAGNOSIS — Z331 Pregnant state, incidental: Secondary | ICD-10-CM

## 2014-11-21 DIAGNOSIS — Z1389 Encounter for screening for other disorder: Secondary | ICD-10-CM

## 2014-11-21 DIAGNOSIS — O0993 Supervision of high risk pregnancy, unspecified, third trimester: Secondary | ICD-10-CM

## 2014-11-21 DIAGNOSIS — O10019 Pre-existing essential hypertension complicating pregnancy, unspecified trimester: Secondary | ICD-10-CM

## 2014-11-21 LAB — POCT URINALYSIS DIPSTICK
Blood, UA: NEGATIVE
Glucose, UA: NEGATIVE
Ketones, UA: NEGATIVE
LEUKOCYTES UA: NEGATIVE
NITRITE UA: NEGATIVE
PROTEIN UA: NEGATIVE

## 2014-11-21 NOTE — Progress Notes (Signed)
Reactive NST   High Risk Pregnancy Diagnosis(es):   Class A2/B DM, Chronic hypertension  G2P0010 3661w6d Estimated Date of Delivery: 12/27/14  Blood pressure 134/92, weight 374 lb 6.4 oz (169.827 kg), last menstrual period 01/11/2014.  Urinalysis: Negative   HPI: CBG excellent on Glyburide 10 BID and metformin 500 BID     BP weight and urine results all reviewed and noted. Patient reports good fetal movement, denies any bleeding and no rupture of membranes symptoms or regular contractions.  Fundal Height:  46 Fetal Heart rate:  145 Edema:  none  Patient is without complaints. All questions were answered.  Assessment:  4861w6d,   Class A2/B DM and chronic hypertension  Medication(s) Plans:  No changes  Treatment Plan:  Continue twice weekly assessments  Follow up in Monday weeks for OB appt, sonogram

## 2014-11-24 ENCOUNTER — Encounter: Payer: Self-pay | Admitting: Obstetrics and Gynecology

## 2014-11-24 ENCOUNTER — Ambulatory Visit (INDEPENDENT_AMBULATORY_CARE_PROVIDER_SITE_OTHER): Payer: Medicaid Other | Admitting: Obstetrics and Gynecology

## 2014-11-24 ENCOUNTER — Ambulatory Visit (INDEPENDENT_AMBULATORY_CARE_PROVIDER_SITE_OTHER): Payer: Medicaid Other

## 2014-11-24 DIAGNOSIS — Z1389 Encounter for screening for other disorder: Secondary | ICD-10-CM

## 2014-11-24 DIAGNOSIS — O09293 Supervision of pregnancy with other poor reproductive or obstetric history, third trimester: Secondary | ICD-10-CM

## 2014-11-24 DIAGNOSIS — O24913 Unspecified diabetes mellitus in pregnancy, third trimester: Secondary | ICD-10-CM

## 2014-11-24 DIAGNOSIS — O163 Unspecified maternal hypertension, third trimester: Secondary | ICD-10-CM

## 2014-11-24 DIAGNOSIS — O0993 Supervision of high risk pregnancy, unspecified, third trimester: Secondary | ICD-10-CM

## 2014-11-24 DIAGNOSIS — O24419 Gestational diabetes mellitus in pregnancy, unspecified control: Secondary | ICD-10-CM

## 2014-11-24 DIAGNOSIS — Z331 Pregnant state, incidental: Secondary | ICD-10-CM

## 2014-11-24 DIAGNOSIS — O10019 Pre-existing essential hypertension complicating pregnancy, unspecified trimester: Secondary | ICD-10-CM

## 2014-11-24 LAB — POCT URINALYSIS DIPSTICK
Blood, UA: NEGATIVE
Glucose, UA: NEGATIVE
Ketones, UA: NEGATIVE
Leukocytes, UA: NEGATIVE
NITRITE UA: NEGATIVE

## 2014-11-24 NOTE — Progress Notes (Signed)
Pt denies any problems or concerns at this time.  

## 2014-11-24 NOTE — Progress Notes (Addendum)
Patient ID: Megan Hardin, female   DOB: 11/10/1981, 33 y.o.   MRN: [id number]  High Risk Pregnancy Diagnosis(es):   Class A2/B DM, Chronic Hypertension  G2P0010 5820w2d Estimated Date of Delivery: 12/27/14  Blood pressure 146/90, weight 374 lb (169.645 kg), last menstrual period 01/11/2014. weight is unchanged since last 2 visits. Urinalysis: Negative CBG's  Are fastings 70-90, , rare 2hr pc are above 130 none above 140.   HPI: Patient is complaining of gradually worsening SOB with exertion..  She has also noticed increased pedal edema bilaterally that started 3 months ago.  She did not experience these symptoms during her first pregnancy.     BP weight and urine results all reviewed and noted. Patient reports good fetal movement, denies any bleeding and no rupture of membranes symptoms or regular contractions.  Fundal Height:  Had u/s Fetal Heart rate:  On u/s Edema:   3= TO just below knees HENT: TMs clear bilaterally Pulmonary: Lungs clear to auscultation bilaterally  Cardiovascular: Heart is regular rate and rhythm 2/6 sem at LUSB but no S4 Musculoskeletal : 3+ pitting edema to just below knees bilaterally   Assessment:  6020w2d, G2P0010  Increasing SOB is felt due to increasing Abd girth , but no evidence of fluid overload. Medication(s) Plans:  No change in medications Treatment Plan:  Advised pt to decrease portion sizes Follow up in 0.5 wk for NST,  1 weeks for OB appt,  Continue biweekly appts for fetal monitoring.  This chart was scribed for Tilda BurrowJohn V. Junior Huezo, MD by Carl Bestelina Holson, ED Scribe. This patient was seen in Room 2 and the patient's care was started at 2:30 PM.

## 2014-11-24 NOTE — Progress Notes (Signed)
U/S(35+2wks)-Frank Breech, active fetus, fluid WNL AFI-8.9cm SDP-3.5cm, FHR-138 bpm, UA Doppler RI-0.57 & 0.55, Gr2 placenta, female fetus, BPP 8/8

## 2014-11-26 ENCOUNTER — Other Ambulatory Visit: Payer: Self-pay | Admitting: *Deleted

## 2014-11-26 ENCOUNTER — Other Ambulatory Visit: Payer: Self-pay | Admitting: Obstetrics and Gynecology

## 2014-11-26 DIAGNOSIS — O24419 Gestational diabetes mellitus in pregnancy, unspecified control: Secondary | ICD-10-CM

## 2014-11-26 DIAGNOSIS — O163 Unspecified maternal hypertension, third trimester: Secondary | ICD-10-CM

## 2014-11-26 MED ORDER — BUTALBITAL-APAP-CAFFEINE 50-325-40 MG PO TABS: 1.0000 | ORAL_TABLET | ORAL | Status: DC | PRN

## 2014-11-27 ENCOUNTER — Ambulatory Visit (INDEPENDENT_AMBULATORY_CARE_PROVIDER_SITE_OTHER): Payer: Medicaid Other | Admitting: Obstetrics & Gynecology

## 2014-11-27 ENCOUNTER — Encounter: Payer: Self-pay | Admitting: Obstetrics & Gynecology

## 2014-11-27 DIAGNOSIS — O0993 Supervision of high risk pregnancy, unspecified, third trimester: Secondary | ICD-10-CM

## 2014-11-27 DIAGNOSIS — O10019 Pre-existing essential hypertension complicating pregnancy, unspecified trimester: Secondary | ICD-10-CM

## 2014-11-27 DIAGNOSIS — O24913 Unspecified diabetes mellitus in pregnancy, third trimester: Secondary | ICD-10-CM

## 2014-11-27 DIAGNOSIS — Z1389 Encounter for screening for other disorder: Secondary | ICD-10-CM

## 2014-11-27 DIAGNOSIS — I10 Essential (primary) hypertension: Secondary | ICD-10-CM

## 2014-11-27 DIAGNOSIS — Z331 Pregnant state, incidental: Secondary | ICD-10-CM

## 2014-11-27 LAB — POCT URINALYSIS DIPSTICK
Blood, UA: NEGATIVE
Glucose, UA: NEGATIVE
Ketones, UA: NEGATIVE
LEUKOCYTES UA: NEGATIVE
Nitrite, UA: NEGATIVE
PROTEIN UA: NEGATIVE

## 2014-11-27 MED ORDER — METFORMIN HCL 500 MG PO TABS: 1000.0000 mg | ORAL_TABLET | Freq: Two times a day (BID) | ORAL | Status: DC

## 2014-11-27 MED ORDER — LABETALOL HCL 200 MG PO TABS: 200.0000 mg | ORAL_TABLET | Freq: Two times a day (BID) | ORAL | Status: DC

## 2014-11-27 NOTE — Progress Notes (Signed)
Reactive NST   High Risk Pregnancy Diagnosis(es):   Class A2/B DM, CHTN  G2P0010 2616w5d Estimated Date of Delivery: 12/27/14  Blood pressure 148/80, weight 372 lb 6.4 oz (168.92 kg), last menstrual period 01/11/2014.  Urinalysis: Negative   HPI: CBG excellent     BP weight and urine results all reviewed and noted. Patient reports good fetal movement, denies any bleeding and no rupture of membranes symptoms or regular contractions.  Fundal Height:  46 Fetal Heart rate:  145 Edema:  2+  Patient is without complaints. All questions were answered.  Assessment:  2116w5d,   Class A2/B DM, CHTN  Medication(s) Plans:  Glyburide 10 mg BID, Metformin 1000 mg BID, labetalol 200 BID  Treatment Plan:  Twice weekly assessments  Follow up in Monday weeks for OB appt, sonogram

## 2014-12-02 ENCOUNTER — Ambulatory Visit (INDEPENDENT_AMBULATORY_CARE_PROVIDER_SITE_OTHER): Payer: Medicaid Other

## 2014-12-02 ENCOUNTER — Encounter: Payer: Self-pay | Admitting: Obstetrics & Gynecology

## 2014-12-02 ENCOUNTER — Ambulatory Visit (INDEPENDENT_AMBULATORY_CARE_PROVIDER_SITE_OTHER): Payer: Medicaid Other | Admitting: Obstetrics & Gynecology

## 2014-12-02 VITALS — BP 140/100 | Temp 97.6°F | Wt 371.4 lb

## 2014-12-02 DIAGNOSIS — O163 Unspecified maternal hypertension, third trimester: Secondary | ICD-10-CM

## 2014-12-02 DIAGNOSIS — O321XX1 Maternal care for breech presentation, fetus 1: Secondary | ICD-10-CM

## 2014-12-02 DIAGNOSIS — Z331 Pregnant state, incidental: Secondary | ICD-10-CM

## 2014-12-02 DIAGNOSIS — O24419 Gestational diabetes mellitus in pregnancy, unspecified control: Secondary | ICD-10-CM

## 2014-12-02 DIAGNOSIS — Z3685 Encounter for antenatal screening for Streptococcus B: Secondary | ICD-10-CM

## 2014-12-02 DIAGNOSIS — O10019 Pre-existing essential hypertension complicating pregnancy, unspecified trimester: Secondary | ICD-10-CM

## 2014-12-02 DIAGNOSIS — O24913 Unspecified diabetes mellitus in pregnancy, third trimester: Secondary | ICD-10-CM

## 2014-12-02 DIAGNOSIS — Z1389 Encounter for screening for other disorder: Secondary | ICD-10-CM

## 2014-12-02 DIAGNOSIS — O0993 Supervision of high risk pregnancy, unspecified, third trimester: Secondary | ICD-10-CM

## 2014-12-02 DIAGNOSIS — Z118 Encounter for screening for other infectious and parasitic diseases: Secondary | ICD-10-CM

## 2014-12-02 DIAGNOSIS — Z1159 Encounter for screening for other viral diseases: Secondary | ICD-10-CM

## 2014-12-02 LAB — US OB FOLLOW UP

## 2014-12-02 LAB — POCT URINALYSIS DIPSTICK
Glucose, UA: NEGATIVE
Ketones, UA: NEGATIVE
LEUKOCYTES UA: NEGATIVE
Nitrite, UA: NEGATIVE
Protein, UA: 1

## 2014-12-02 MED ORDER — LABETALOL HCL 200 MG PO TABS
ORAL_TABLET | ORAL | Status: DC
Start: 2014-12-02 — End: 2014-12-24

## 2014-12-02 NOTE — Progress Notes (Signed)
U/S(36+3wks)-Frank Breech active fetus, BPP 8/8, fluid WNL AFI-9.1cm SDP-4.5cm, anterior Gr 3 placenta, FHR-138bpm, UA Doppler RI-0.65 & 0.63, female fetus, EFW 7 lb 9 oz (84th%tile)

## 2014-12-02 NOTE — Addendum Note (Signed)
Addended by: Criss AlvinePULLIAM, Cannon Arreola G on: 12/02/2014 02:29 PM   Modules accepted: Orders

## 2014-12-02 NOTE — Progress Notes (Signed)
Sonogram Breech, otherwise normal, reassuring   High Risk Pregnancy Diagnosis(es):   Class A2/B DM, chronic Hypertesnion  G2P0010 2566w3d Estimated Date of Delivery: 12/27/14  Blood pressure 140/100, temperature 97.6 F (36.4 C), weight 371 lb 6.4 oz (168.466 kg), last menstrual period 01/11/2014.  Urinalysis: Positive for 1+ protein   HPI: CBG are excellent     BP weight and urine results all reviewed and noted. Patient reports good fetal movement, denies any bleeding and no rupture of membranes symptoms or regular contractions.  Fundal Height:  48 Fetal Heart rate:  140 Edema:  1+  Patient is without complaints. All questions were answered.  Assessment:  2366w3d,   Class A2/B DM, chronic hypertension  Medication(s) Plans:  Increase labetalol to 400 BID  Treatment Plan:  Sonogram next monday  Follow up in monday weeks for OB appt, sonogram

## 2014-12-03 ENCOUNTER — Other Ambulatory Visit: Payer: Self-pay | Admitting: Obstetrics and Gynecology

## 2014-12-03 DIAGNOSIS — O10919 Unspecified pre-existing hypertension complicating pregnancy, unspecified trimester: Secondary | ICD-10-CM

## 2014-12-03 DIAGNOSIS — O24419 Gestational diabetes mellitus in pregnancy, unspecified control: Secondary | ICD-10-CM

## 2014-12-03 DIAGNOSIS — O09293 Supervision of pregnancy with other poor reproductive or obstetric history, third trimester: Secondary | ICD-10-CM

## 2014-12-03 LAB — GC/CHLAMYDIA PROBE AMP
CT Probe RNA: NEGATIVE
GC Probe RNA: NEGATIVE

## 2014-12-04 ENCOUNTER — Other Ambulatory Visit: Payer: Self-pay | Admitting: Obstetrics and Gynecology

## 2014-12-04 ENCOUNTER — Other Ambulatory Visit: Payer: Medicaid Other | Admitting: Obstetrics & Gynecology

## 2014-12-04 ENCOUNTER — Other Ambulatory Visit: Payer: Medicaid Other

## 2014-12-04 ENCOUNTER — Ambulatory Visit (INDEPENDENT_AMBULATORY_CARE_PROVIDER_SITE_OTHER): Payer: Medicaid Other | Admitting: Obstetrics and Gynecology

## 2014-12-04 ENCOUNTER — Encounter: Payer: Self-pay | Admitting: Obstetrics and Gynecology

## 2014-12-04 VITALS — BP 148/92 | Wt 373.0 lb

## 2014-12-04 DIAGNOSIS — O10019 Pre-existing essential hypertension complicating pregnancy, unspecified trimester: Secondary | ICD-10-CM

## 2014-12-04 DIAGNOSIS — O24913 Unspecified diabetes mellitus in pregnancy, third trimester: Secondary | ICD-10-CM

## 2014-12-04 DIAGNOSIS — Z331 Pregnant state, incidental: Secondary | ICD-10-CM

## 2014-12-04 DIAGNOSIS — Z1389 Encounter for screening for other disorder: Secondary | ICD-10-CM

## 2014-12-04 DIAGNOSIS — O0993 Supervision of high risk pregnancy, unspecified, third trimester: Secondary | ICD-10-CM

## 2014-12-04 LAB — POCT URINALYSIS DIPSTICK
Blood, UA: NEGATIVE
Glucose, UA: NEGATIVE
KETONES UA: NEGATIVE
Leukocytes, UA: NEGATIVE
Nitrite, UA: NEGATIVE

## 2014-12-04 LAB — STREP B DNA PROBE: GBSP: DETECTED

## 2014-12-04 NOTE — Progress Notes (Signed)
Pt concerned about her BP.

## 2014-12-08 ENCOUNTER — Ambulatory Visit (INDEPENDENT_AMBULATORY_CARE_PROVIDER_SITE_OTHER): Payer: Medicaid Other | Admitting: Obstetrics & Gynecology

## 2014-12-08 ENCOUNTER — Encounter: Payer: Self-pay | Admitting: Obstetrics & Gynecology

## 2014-12-08 ENCOUNTER — Ambulatory Visit (INDEPENDENT_AMBULATORY_CARE_PROVIDER_SITE_OTHER): Payer: Medicaid Other

## 2014-12-08 ENCOUNTER — Telehealth: Payer: Self-pay | Admitting: *Deleted

## 2014-12-08 VITALS — BP 146/90 | Wt 374.2 lb

## 2014-12-08 DIAGNOSIS — Z331 Pregnant state, incidental: Secondary | ICD-10-CM

## 2014-12-08 DIAGNOSIS — O24419 Gestational diabetes mellitus in pregnancy, unspecified control: Secondary | ICD-10-CM

## 2014-12-08 DIAGNOSIS — O10919 Unspecified pre-existing hypertension complicating pregnancy, unspecified trimester: Secondary | ICD-10-CM

## 2014-12-08 DIAGNOSIS — O10912 Unspecified pre-existing hypertension complicating pregnancy, second trimester: Secondary | ICD-10-CM

## 2014-12-08 DIAGNOSIS — Z1389 Encounter for screening for other disorder: Secondary | ICD-10-CM

## 2014-12-08 DIAGNOSIS — O10019 Pre-existing essential hypertension complicating pregnancy, unspecified trimester: Secondary | ICD-10-CM

## 2014-12-08 DIAGNOSIS — O321XX1 Maternal care for breech presentation, fetus 1: Secondary | ICD-10-CM

## 2014-12-08 DIAGNOSIS — O09293 Supervision of pregnancy with other poor reproductive or obstetric history, third trimester: Secondary | ICD-10-CM

## 2014-12-08 DIAGNOSIS — O24913 Unspecified diabetes mellitus in pregnancy, third trimester: Secondary | ICD-10-CM

## 2014-12-08 DIAGNOSIS — O0993 Supervision of high risk pregnancy, unspecified, third trimester: Secondary | ICD-10-CM

## 2014-12-08 LAB — POCT URINALYSIS DIPSTICK
Glucose, UA: NEGATIVE
Ketones, UA: NEGATIVE
Leukocytes, UA: NEGATIVE
Nitrite, UA: NEGATIVE
PROTEIN UA: 1
RBC UA: NEGATIVE

## 2014-12-08 NOTE — Progress Notes (Signed)
Sonogram is reviewed and read report is done, Megan Hardin Breech   High Risk Pregnancy Diagnosis(es):   Class A2/B DM, chronic hypertension Breech  G2P0010 [redacted]w[redacted]d Estimated Date of Delivery: 12/27/14  Blood pressure 146/90, weight 374 lb 3.2 oz (169.736 kg), last menstrual period 01/11/2014.  Urinalysis: Positive for 1+ protein   HPI: CBG are good     BP weight and urine results all reviewed and noted. Patient reports good fetal movement, denies any bleeding and no rupture of membranes symptoms or regular contractions.  Fundal Height:  50 Fetal Heart rate:  145 Edema:  none  Patient is without complaints. All questions were answered.  Assessment:  100w2d,   Class A2/B Diabetes  Medication(s) Plans:  No changes  Treatment Plan:  NST thursday  Follow up in thursday weeks for OB appt, NST

## 2014-12-08 NOTE — Progress Notes (Signed)
U/S(37+2wks)-Frank breech active fetus BPP 8/8, FHR- 137 bpm, UA Doppler RI-0.56 & 0.57, AFI- 8.4cm, anterior Gr 3 placenta

## 2014-12-08 NOTE — Addendum Note (Signed)
Addended by: Criss Alvine on: 12/08/2014 02:45 PM   Modules accepted: Orders

## 2014-12-08 NOTE — Telephone Encounter (Signed)
Morrie Sheldon from [facility] in [place], [place] called asking if Dr. Despina Hidden would increase the Metformin quantity from # 60 to #120 to last pt a full month. Dr. Despina Hidden gave verbal ok.

## 2014-12-09 ENCOUNTER — Other Ambulatory Visit: Payer: Medicaid Other

## 2014-12-09 LAB — PROTEIN / CREATININE RATIO, URINE
Creatinine, Urine: 145.3 mg/dL
Protein Creatinine Ratio: 0.23 — ABNORMAL HIGH (ref <0.15)
Total Protein, Urine: 34 mg/dL — ABNORMAL HIGH (ref 5–24)

## 2014-12-11 ENCOUNTER — Ambulatory Visit (INDEPENDENT_AMBULATORY_CARE_PROVIDER_SITE_OTHER): Payer: Medicaid Other | Admitting: Obstetrics & Gynecology

## 2014-12-11 VITALS — BP 148/84 | Wt 371.4 lb

## 2014-12-11 DIAGNOSIS — O10019 Pre-existing essential hypertension complicating pregnancy, unspecified trimester: Secondary | ICD-10-CM

## 2014-12-11 DIAGNOSIS — O24913 Unspecified diabetes mellitus in pregnancy, third trimester: Secondary | ICD-10-CM

## 2014-12-11 DIAGNOSIS — O0993 Supervision of high risk pregnancy, unspecified, third trimester: Secondary | ICD-10-CM

## 2014-12-11 NOTE — Addendum Note (Signed)
Addended by: Criss AlvinePULLIAM, Lesa Vandall G on: 12/11/2014 12:03 PM   Modules accepted: Orders

## 2014-12-11 NOTE — Progress Notes (Signed)
Reactive NST, breech  High Risk Pregnancy Diagnosis(es):   Class A2/B DM, Chronic Hypertension  G2P0010 2481w5d Estimated Date of Delivery: 12/27/14  Blood pressure 148/84, weight 371 lb 6.4 oz (168.466 kg), last menstrual period 01/11/2014.  Urinalysis: Positive for 1+ protein   HPI: CBG look good, BP stable, protein stable recheck again     BP weight and urine results all reviewed and noted. Patient reports good fetal movement, denies any bleeding and no rupture of membranes symptoms or regular contractions.  Fundal Height:  50 Fetal Heart rate:  145 Edema:  2+  Patient is without complaints. All questions were answered.  Assessment:  5481w5d,   Class A2/B diabetes, Chronic hypertension, breech  Medication(s) Plans:  No changes  Treatment Plan:  same  Follow up in Monday weeks for OB appt, sonogram

## 2014-12-12 LAB — PROTEIN / CREATININE RATIO, URINE
Creatinine, Urine: 419.7 mg/dL
Protein Creatinine Ratio: 0.12 (ref <0.15)
Total Protein, Urine: 50 mg/dL — ABNORMAL HIGH (ref 5–24)

## 2014-12-15 ENCOUNTER — Encounter: Payer: Medicaid Other | Admitting: Obstetrics & Gynecology

## 2014-12-15 ENCOUNTER — Ambulatory Visit (INDEPENDENT_AMBULATORY_CARE_PROVIDER_SITE_OTHER): Payer: Medicaid Other

## 2014-12-15 ENCOUNTER — Ambulatory Visit (INDEPENDENT_AMBULATORY_CARE_PROVIDER_SITE_OTHER): Payer: Medicaid Other | Admitting: Obstetrics & Gynecology

## 2014-12-15 ENCOUNTER — Other Ambulatory Visit: Payer: Self-pay | Admitting: Obstetrics & Gynecology

## 2014-12-15 VITALS — BP 146/92 | Wt 373.2 lb

## 2014-12-15 DIAGNOSIS — Z1389 Encounter for screening for other disorder: Secondary | ICD-10-CM

## 2014-12-15 DIAGNOSIS — O24913 Unspecified diabetes mellitus in pregnancy, third trimester: Secondary | ICD-10-CM

## 2014-12-15 DIAGNOSIS — Z331 Pregnant state, incidental: Secondary | ICD-10-CM

## 2014-12-15 DIAGNOSIS — O0993 Supervision of high risk pregnancy, unspecified, third trimester: Secondary | ICD-10-CM

## 2014-12-15 DIAGNOSIS — O09893 Supervision of other high risk pregnancies, third trimester: Secondary | ICD-10-CM

## 2014-12-15 DIAGNOSIS — O10019 Pre-existing essential hypertension complicating pregnancy, unspecified trimester: Secondary | ICD-10-CM

## 2014-12-15 LAB — US OB FOLLOW UP

## 2014-12-15 NOTE — Progress Notes (Signed)
U/S(38+2wks)-Breech active fetus, BPP 8/8, fluid WNL AFI-15.3cm SDP-6.7cm, FHR-154 bpm, UA Doppler RI-0.52, anterior Gr 3 placenta, EFW 8 lb 10 oz (90th%tile)

## 2014-12-15 NOTE — Progress Notes (Signed)
Sonogram is normal with reassuring fetal surveillance, report is done   High Risk Pregnancy Diagnosis(es):   Class A2/B diabetes, chronic hypertension  G2P0010 6790w2d Estimated Date of Delivery: 12/27/14  Blood pressure 146/92, weight 373 lb 3.2 oz (169.282 kg), last menstrual period 01/11/2014.  Urinalysis: Positive for 1+   HPI: Had some contractions Saturday resolved now     BP weight and urine results all reviewed and noted. Patient reports good fetal movement, denies any bleeding and no rupture of membranes symptoms or regular contractions.  Fundal Height:  50 Fetal Heart rate:  154 Edema:  none  Patient is without complaints. All questions were answered.  Assessment:  3790w2d,   Class A2/B diabetes  Medication(s) Plans:  Decrease glyburide 5 mg BID  Treatment Plan:  Twice weekly assessment  Follow up in tursday weeks for OB appt, NST

## 2014-12-16 ENCOUNTER — Inpatient Hospital Stay (HOSPITAL_COMMUNITY)
Admission: AD | Admit: 2014-12-16 | Discharge: 2014-12-16 | Disposition: A | Discharge status: Home / Self Care | Payer: Medicaid Other | Source: Ambulatory Visit | Attending: Obstetrics & Gynecology | Admitting: Obstetrics & Gynecology

## 2014-12-16 ENCOUNTER — Telehealth: Payer: Self-pay | Admitting: *Deleted

## 2014-12-16 DIAGNOSIS — O9989 Other specified diseases and conditions complicating pregnancy, childbirth and the puerperium: Secondary | ICD-10-CM | POA: Diagnosis not present

## 2014-12-16 DIAGNOSIS — O321XX0 Maternal care for breech presentation, not applicable or unspecified: Secondary | ICD-10-CM | POA: Diagnosis not present

## 2014-12-16 DIAGNOSIS — Z3A38 38 weeks gestation of pregnancy: Secondary | ICD-10-CM | POA: Insufficient documentation

## 2014-12-16 LAB — POCT URINALYSIS DIPSTICK: Glucose, UA: NEGATIVE

## 2014-12-16 LAB — POCT FERN TEST: POCT Fern Test: NEGATIVE

## 2014-12-16 NOTE — MAU Note (Signed)
Scheduled for C/S on 1/18, breech

## 2014-12-16 NOTE — MAU Note (Signed)
Has noticed wet underwear since 0900 this a.m., noted some pink with wiping.  Having irregular uc's.

## 2014-12-16 NOTE — Telephone Encounter (Signed)
Pt called stating her underwear was soaked, not sure if her water broke or not, irregular contractions. [facility] Pt verbalized understanding. Cathie BeamsFran Cresenzo-Dishmon, CNM notified.

## 2014-12-16 NOTE — Discharge Instructions (Signed)
Keep your scheduled appointment for prenatal care. Drink 8-10 glasses of water per day. °

## 2014-12-18 ENCOUNTER — Encounter: Payer: Self-pay | Admitting: Obstetrics & Gynecology

## 2014-12-18 ENCOUNTER — Ambulatory Visit: Payer: Medicaid Other | Admitting: Obstetrics & Gynecology

## 2014-12-18 ENCOUNTER — Ambulatory Visit (INDEPENDENT_AMBULATORY_CARE_PROVIDER_SITE_OTHER): Payer: Medicaid Other | Admitting: Obstetrics & Gynecology

## 2014-12-18 VITALS — BP 138/88 | Wt 369.0 lb

## 2014-12-18 DIAGNOSIS — Z331 Pregnant state, incidental: Secondary | ICD-10-CM

## 2014-12-18 DIAGNOSIS — O09893 Supervision of other high risk pregnancies, third trimester: Secondary | ICD-10-CM

## 2014-12-18 DIAGNOSIS — O24913 Unspecified diabetes mellitus in pregnancy, third trimester: Secondary | ICD-10-CM

## 2014-12-18 DIAGNOSIS — Z1389 Encounter for screening for other disorder: Secondary | ICD-10-CM

## 2014-12-18 DIAGNOSIS — O10019 Pre-existing essential hypertension complicating pregnancy, unspecified trimester: Secondary | ICD-10-CM

## 2014-12-18 LAB — POCT URINALYSIS DIPSTICK
GLUCOSE UA: NEGATIVE
Ketones, UA: NEGATIVE
Leukocytes, UA: NEGATIVE
Nitrite, UA: NEGATIVE
Protein, UA: 1
RBC UA: NEGATIVE

## 2014-12-18 NOTE — Progress Notes (Signed)
Reactive NST   High Risk Pregnancy Diagnosis(es):   Class A2/B diabetes, Chronic Hypertension  G2P0010 661w5d Estimated Date of Delivery: 12/27/14  Blood pressure 138/88, weight 369 lb (167.377 kg), last menstrual period 01/11/2014.  Urinalysis: Negative   HPI: No complaints     BP weight and urine results all reviewed and noted. Patient reports good fetal movement, denies any bleeding and no rupture of membranes symptoms or regular contractions.  Fundal Height:  52 Fetal Heart rate:  145 Edema:  1+  Patient is without complaints. All questions were answered.  Assessment:  701w5d,   Class A2/B diabetes, Chronic Hypertension  Medication(s) Plans:  No changes  Treatment Plan:  C Section 12/22/2013 for breech  Follow up in 1 1/2  weeks for OB appt, post op

## 2014-12-19 ENCOUNTER — Encounter (HOSPITAL_COMMUNITY)
Admission: RE | Admit: 2014-12-19 | Discharge: 2014-12-19 | Disposition: A | Discharge status: Home / Self Care | Payer: Medicaid Other | Source: Ambulatory Visit | Attending: Obstetrics and Gynecology | Admitting: Obstetrics and Gynecology

## 2014-12-19 ENCOUNTER — Encounter (HOSPITAL_COMMUNITY): Payer: Self-pay

## 2014-12-19 VITALS — BP 145/90 | HR 76 | Temp 98.0°F | Resp 20 | Ht 68.0 in | Wt 372.0 lb

## 2014-12-19 DIAGNOSIS — O0993 Supervision of high risk pregnancy, unspecified, third trimester: Secondary | ICD-10-CM

## 2014-12-19 HISTORY — DX: Reserved for inherently not codable concepts without codable children: IMO0001

## 2014-12-19 LAB — COMPREHENSIVE METABOLIC PANEL
ALK PHOS: 152 U/L — AB (ref 39–117)
ALT: 18 U/L (ref 0–35)
ANION GAP: 7 (ref 5–15)
AST: 19 U/L (ref 0–37)
Albumin: 2.8 g/dL — ABNORMAL LOW (ref 3.5–5.2)
BUN: 11 mg/dL (ref 6–23)
CO2: 23 mmol/L (ref 19–32)
Calcium: 9.3 mg/dL (ref 8.4–10.5)
Chloride: 105 mEq/L (ref 96–112)
Creatinine, Ser: 0.57 mg/dL (ref 0.50–1.10)
Glucose, Bld: 109 mg/dL — ABNORMAL HIGH (ref 70–99)
POTASSIUM: 4.6 mmol/L (ref 3.5–5.1)
Sodium: 135 mmol/L (ref 135–145)
TOTAL PROTEIN: 6.4 g/dL (ref 6.0–8.3)
Total Bilirubin: 0.3 mg/dL (ref 0.3–1.2)

## 2014-12-19 LAB — URINALYSIS, ROUTINE W REFLEX MICROSCOPIC
BILIRUBIN URINE: NEGATIVE
GLUCOSE, UA: NEGATIVE mg/dL
Hgb urine dipstick: NEGATIVE
Ketones, ur: 15 mg/dL — AB
Leukocytes, UA: NEGATIVE
NITRITE: NEGATIVE
PH: 6 (ref 5.0–8.0)
PROTEIN: 30 mg/dL — AB
Specific Gravity, Urine: >1.03 — ABNORMAL HIGH (ref 1.005–1.030)
UROBILINOGEN UA: 0.2 mg/dL (ref 0.0–1.0)

## 2014-12-19 LAB — CBC
HCT: 33.1 % — ABNORMAL LOW (ref 36.0–46.0)
Hemoglobin: 11 g/dL — ABNORMAL LOW (ref 12.0–15.0)
MCH: 30.6 pg (ref 26.0–34.0)
MCHC: 33.2 g/dL (ref 30.0–36.0)
MCV: 91.9 fL (ref 78.0–100.0)
Platelets: 359 10*3/uL (ref 150–400)
RBC: 3.6 MIL/uL — AB (ref 3.87–5.11)
RDW: 13.9 % (ref 11.5–15.5)
WBC: 14 10*3/uL — AB (ref 4.0–10.5)

## 2014-12-19 LAB — URINE MICROSCOPIC-ADD ON

## 2014-12-19 LAB — ABO/RH: ABO/RH(D): O POS

## 2014-12-19 LAB — TYPE AND SCREEN
ABO/RH(D): O POS
ANTIBODY SCREEN: NEGATIVE

## 2014-12-19 NOTE — Patient Instructions (Addendum)
Your procedure is scheduled on: 12/22/14  Enter through the Main Entrance at :1130 am  Pick up desk phone and dial [phone] and inform us of your arrival.  Please call [phone] if you have any problems the morning of surgery.  Remember: Do not eat food after midnight:Sunday Clear liquids are ok until:9am on 12/22/14   You may brush your teeth the morning of surgery. Do not take Metformin for 24 hours prior to surgery---ok to take Sunday am.  Take Glyburide the night before surgery but not on am of surgery. Take Labetalol as normal.  DO NOT wear jewelry, eye make-up, lipstick,body lotion, or dark fingernail polish.  (Polished toes are ok) You may wear deodorant.  If you are to be admitted after surgery, leave suitcase in car until your room has been assigned. Patients discharged on the day of surgery will not be allowed to drive home. Wear loose fitting, comfortable clothes for your ride home.

## 2014-12-19 NOTE — Pre-Procedure Instructions (Signed)
I called Dr office to report WBC of 14.0 but office closed. Pt's temp at PAT was below normal.

## 2014-12-20 ENCOUNTER — Other Ambulatory Visit: Payer: Self-pay | Admitting: Obstetrics and Gynecology

## 2014-12-20 LAB — RPR: RPR: NONREACTIVE

## 2014-12-20 NOTE — H&P (Signed)
Megan Hardin is a 34 y.o. female presenting for Cesarean section for breech presentation, not interested in External Version effort.. Pt has been counselled over treatment recommendations, alternatives, and she choses primary cesarean delivery. Usual risks are reviewed, including bleeding , infection, injury to adjacent organs, reoperation, and increased risk to future efforts to deliver vaginally History OB History    Gravida Para Term Preterm AB TAB SAB Ectopic Multiple Living   2    1  1         Past Medical History  Diagnosis Date  . PCOS (polycystic ovarian syndrome)   . Miscarriage   . Obesity   . Pregnant 04/30/2014  . Shortness of breath dyspnea     on exertion due to weight  . Gestational diabetes mellitus, antepartum    Past Surgical History  Procedure Laterality Date  . Eye surgery Left   . Dilation and curettage of uterus N/A 02/05/2014    Procedure: CERVICAL DILATATION AND SUCTION/SHARP UTERINE EVACUATION;  Surgeon: Lazaro ArmsLuther H Eure, MD;  Location: [facility];  Service: Gynecology;  Laterality: N/A;   Family History: family history includes Alzheimer's disease in her maternal grandfather; Diabetes in her maternal grandfather, maternal grandmother, and mother; Hypertension in her father; Other in her paternal grandmother. Social History:  reports that she has quit smoking. Her smoking use included Cigarettes. She has never used smokeless tobacco. She reports that she does not drink alcohol or use illicit drugs.   Prenatal Transfer Tool  Maternal Diabetes: Yes:  Diabetes Type:  Insulin/Medication controlled glyburide and metformin Genetic Screening: Normal Maternal Ultrasounds/Referrals: Normal Fetal Ultrasounds or other Referrals:  None Maternal Substance Abuse:  No Significant Maternal Medications:  Meds include: Other: /ASA 81 MG/D, labetolol 200 mg bid Significant Maternal Lab Results:  Lab values include: Group B Strep positive Other Comments:  None  ROS    Last  menstrual period 01/11/2014. Exam Physical Exam  Constitutional: She appears well-developed and well-nourished.  HENT:  Head: Normocephalic.  Eyes: Pupils are equal, round, and reactive to light.  Neck: Normal range of motion.  Cardiovascular: Normal rate.   Respiratory: Effort normal.  Shortness of breath with exertion   Genitourinary: Vagina normal.  geravid uterus c/w dates, breech per u/s at last visit.    Prenatal labs: ABO, Rh: --/--/O POS, O POS (01/15 1230) Antibody: NEG (01/15 1230) Rubella: 0.96 (06/30 0946) RPR: Non Reactive (01/15 1230)  HBsAg: NEGATIVE (06/30 0946)  HIV: NONREACTIVE (11/03 1142)  GBS: Detected (12/29 1520)   Assessment/Plan: Pregnancy 39w 2d, persistent Breech presentation , for primary cesarean section on Monday 12/22/14   Jurell Basista V 12/20/2014, 9:57 PM

## 2014-12-21 MED ORDER — DEXTROSE 5 % IV SOLN
3.0000 g | INTRAVENOUS | Status: AC
  Administered 2014-12-22: 3 g via INTRAVENOUS
  Filled 2014-12-21: qty 3000

## 2014-12-22 ENCOUNTER — Encounter (HOSPITAL_COMMUNITY)
Admission: RE | Disposition: A | Discharge status: Home / Self Care | Payer: Self-pay | Source: Ambulatory Visit | Attending: Obstetrics and Gynecology

## 2014-12-22 ENCOUNTER — Encounter (HOSPITAL_COMMUNITY): Payer: Self-pay

## 2014-12-22 ENCOUNTER — Inpatient Hospital Stay (HOSPITAL_COMMUNITY)
Admission: RE | Admit: 2014-12-22 | Discharge: 2014-12-24 | DRG: 765 | Disposition: A | Discharge status: Home / Self Care | Payer: Medicaid Other | Source: Ambulatory Visit | Attending: Obstetrics and Gynecology | Admitting: Obstetrics and Gynecology

## 2014-12-22 ENCOUNTER — Inpatient Hospital Stay (HOSPITAL_COMMUNITY): Payer: Medicaid Other | Admitting: Registered Nurse

## 2014-12-22 DIAGNOSIS — O321XX0 Maternal care for breech presentation, not applicable or unspecified: Secondary | ICD-10-CM | POA: Diagnosis present

## 2014-12-22 DIAGNOSIS — O99824 Streptococcus B carrier state complicating childbirth: Secondary | ICD-10-CM | POA: Diagnosis present

## 2014-12-22 DIAGNOSIS — Z3483 Encounter for supervision of other normal pregnancy, third trimester: Secondary | ICD-10-CM | POA: Diagnosis present

## 2014-12-22 DIAGNOSIS — O0993 Supervision of high risk pregnancy, unspecified, third trimester: Secondary | ICD-10-CM

## 2014-12-22 DIAGNOSIS — O24429 Gestational diabetes mellitus in childbirth, unspecified control: Secondary | ICD-10-CM | POA: Diagnosis present

## 2014-12-22 DIAGNOSIS — Z98891 History of uterine scar from previous surgery: Secondary | ICD-10-CM

## 2014-12-22 DIAGNOSIS — O133 Gestational [pregnancy-induced] hypertension without significant proteinuria, third trimester: Secondary | ICD-10-CM | POA: Diagnosis present

## 2014-12-22 DIAGNOSIS — O321XX1 Maternal care for breech presentation, fetus 1: Secondary | ICD-10-CM

## 2014-12-22 DIAGNOSIS — Z3A39 39 weeks gestation of pregnancy: Secondary | ICD-10-CM | POA: Diagnosis present

## 2014-12-22 DIAGNOSIS — O99214 Obesity complicating childbirth: Secondary | ICD-10-CM | POA: Diagnosis present

## 2014-12-22 LAB — GLUCOSE, CAPILLARY
Glucose-Capillary: 89 mg/dL (ref 70–99)
Glucose-Capillary: 72 mg/dL (ref 70–99)

## 2014-12-22 SURGERY — Surgical Case
Anesthesia: Spinal | Site: Abdomen

## 2014-12-22 MED ORDER — KETOROLAC TROMETHAMINE 30 MG/ML IJ SOLN
30.0000 mg | Freq: Four times a day (QID) | INTRAMUSCULAR | Status: AC | PRN
Start: 2014-12-22 — End: 2014-12-23

## 2014-12-22 MED ORDER — MEPERIDINE HCL 25 MG/ML IJ SOLN: 6.2500 mg | INTRAMUSCULAR | Status: DC | PRN

## 2014-12-22 MED ORDER — DIBUCAINE 1 % RE OINT: 1.0000 "application " | TOPICAL_OINTMENT | RECTAL | Status: DC | PRN

## 2014-12-22 MED ORDER — SIMETHICONE 80 MG PO CHEW: 80.0000 mg | CHEWABLE_TABLET | ORAL | Status: DC | PRN

## 2014-12-22 MED ORDER — OXYTOCIN 10 UNIT/ML IJ SOLN
40.0000 [IU] | INTRAVENOUS | Status: DC | PRN
  Administered 2014-12-22: 40 [IU] via INTRAVENOUS

## 2014-12-22 MED ORDER — FAMOTIDINE IN NACL 20-0.9 MG/50ML-% IV SOLN
INTRAVENOUS | Status: AC
  Administered 2014-12-22: 20 mg via INTRAVENOUS
  Filled 2014-12-22: qty 50

## 2014-12-22 MED ORDER — SENNOSIDES-DOCUSATE SODIUM 8.6-50 MG PO TABS
2.0000 | ORAL_TABLET | ORAL | Status: DC
  Administered 2014-12-23 – 2014-12-24 (×2): 2 via ORAL
  Filled 2014-12-22 (×2): qty 2

## 2014-12-22 MED ORDER — SCOPOLAMINE 1 MG/3DAYS TD PT72
MEDICATED_PATCH | TRANSDERMAL | Status: AC
  Administered 2014-12-22: 1.5 mg via TRANSDERMAL
  Filled 2014-12-22: qty 1

## 2014-12-22 MED ORDER — OXYCODONE-ACETAMINOPHEN 5-325 MG PO TABS
1.0000 | ORAL_TABLET | ORAL | Status: DC | PRN
  Administered 2014-12-23 – 2014-12-24 (×3): 1 via ORAL
  Filled 2014-12-22 (×3): qty 1

## 2014-12-22 MED ORDER — NALOXONE HCL 1 MG/ML IJ SOLN: 1.0000 ug/kg/h | INTRAVENOUS | Status: DC | PRN

## 2014-12-22 MED ORDER — SIMETHICONE 80 MG PO CHEW
80.0000 mg | CHEWABLE_TABLET | ORAL | Status: DC
  Administered 2014-12-23 – 2014-12-24 (×2): 80 mg via ORAL
  Filled 2014-12-22 (×2): qty 1

## 2014-12-22 MED ORDER — LABETALOL HCL 100 MG PO TABS
100.0000 mg | ORAL_TABLET | Freq: Two times a day (BID) | ORAL | Status: DC
  Administered 2014-12-22 – 2014-12-24 (×4): 100 mg via ORAL
  Filled 2014-12-22 (×4): qty 1

## 2014-12-22 MED ORDER — DIPHENHYDRAMINE HCL 25 MG PO CAPS: 25.0000 mg | ORAL_CAPSULE | ORAL | Status: DC | PRN

## 2014-12-22 MED ORDER — ONDANSETRON HCL 4 MG/2ML IJ SOLN
INTRAMUSCULAR | Status: AC
  Filled 2014-12-22: qty 2

## 2014-12-22 MED ORDER — ZOLPIDEM TARTRATE 5 MG PO TABS: 5.0000 mg | ORAL_TABLET | Freq: Every evening | ORAL | Status: DC | PRN

## 2014-12-22 MED ORDER — MORPHINE SULFATE (PF) 0.5 MG/ML IJ SOLN
INTRAMUSCULAR | Status: DC | PRN
  Administered 2014-12-22: .2 mg via INTRATHECAL

## 2014-12-22 MED ORDER — NALBUPHINE HCL 10 MG/ML IJ SOLN: 5.0000 mg | Freq: Once | INTRAMUSCULAR | Status: AC | PRN

## 2014-12-22 MED ORDER — ONDANSETRON HCL 4 MG/2ML IJ SOLN
INTRAMUSCULAR | Status: DC | PRN
  Administered 2014-12-22: 4 mg via INTRAVENOUS

## 2014-12-22 MED ORDER — MENTHOL 3 MG MT LOZG: 1.0000 | LOZENGE | OROMUCOSAL | Status: DC | PRN

## 2014-12-22 MED ORDER — OXYCODONE-ACETAMINOPHEN 5-325 MG PO TABS: 2.0000 | ORAL_TABLET | ORAL | Status: DC | PRN

## 2014-12-22 MED ORDER — LACTATED RINGERS IV SOLN
INTRAVENOUS | Status: DC
  Administered 2014-12-22 (×3): via INTRAVENOUS

## 2014-12-22 MED ORDER — LACTATED RINGERS IV SOLN
INTRAVENOUS | Status: DC
  Administered 2014-12-22: 22:00:00 via INTRAVENOUS

## 2014-12-22 MED ORDER — OXYTOCIN 10 UNIT/ML IJ SOLN
INTRAMUSCULAR | Status: AC
  Filled 2014-12-22: qty 4

## 2014-12-22 MED ORDER — SODIUM CHLORIDE 0.9 % IJ SOLN: 3.0000 mL | INTRAMUSCULAR | Status: DC | PRN

## 2014-12-22 MED ORDER — KETOROLAC TROMETHAMINE 30 MG/ML IJ SOLN: 30.0000 mg | Freq: Four times a day (QID) | INTRAMUSCULAR | Status: AC | PRN

## 2014-12-22 MED ORDER — WITCH HAZEL-GLYCERIN EX PADS: 1.0000 "application " | MEDICATED_PAD | CUTANEOUS | Status: DC | PRN

## 2014-12-22 MED ORDER — SCOPOLAMINE 1 MG/3DAYS TD PT72
1.0000 | MEDICATED_PATCH | Freq: Once | TRANSDERMAL | Status: DC
  Administered 2014-12-22: 1.5 mg via TRANSDERMAL

## 2014-12-22 MED ORDER — BUPIVACAINE IN DEXTROSE 0.75-8.25 % IT SOLN
INTRATHECAL | Status: DC | PRN
  Administered 2014-12-22: 1.8 mg via INTRATHECAL

## 2014-12-22 MED ORDER — ONDANSETRON HCL 4 MG/2ML IJ SOLN: 4.0000 mg | Freq: Once | INTRAMUSCULAR | Status: DC | PRN

## 2014-12-22 MED ORDER — CEFAZOLIN SODIUM-DEXTROSE 2-3 GM-% IV SOLR
INTRAVENOUS | Status: AC
  Filled 2014-12-22: qty 50

## 2014-12-22 MED ORDER — IBUPROFEN 600 MG PO TABS
600.0000 mg | ORAL_TABLET | Freq: Four times a day (QID) | ORAL | Status: DC
  Administered 2014-12-23 – 2014-12-24 (×8): 600 mg via ORAL
  Filled 2014-12-22 (×8): qty 1

## 2014-12-22 MED ORDER — FAMOTIDINE IN NACL 20-0.9 MG/50ML-% IV SOLN
20.0000 mg | Freq: Once | INTRAVENOUS | Status: AC
  Administered 2014-12-22: 20 mg via INTRAVENOUS

## 2014-12-22 MED ORDER — DIPHENHYDRAMINE HCL 50 MG/ML IJ SOLN: 12.5000 mg | INTRAMUSCULAR | Status: DC | PRN

## 2014-12-22 MED ORDER — ONDANSETRON HCL 4 MG/2ML IJ SOLN: 4.0000 mg | INTRAMUSCULAR | Status: DC | PRN

## 2014-12-22 MED ORDER — PHENYLEPHRINE 8 MG IN D5W 100 ML (0.08MG/ML) PREMIX OPTIME
INJECTION | INTRAVENOUS | Status: DC | PRN
  Administered 2014-12-22: 30 ug/min via INTRAVENOUS

## 2014-12-22 MED ORDER — NALBUPHINE HCL 10 MG/ML IJ SOLN: 5.0000 mg | INTRAMUSCULAR | Status: DC | PRN

## 2014-12-22 MED ORDER — SCOPOLAMINE 1 MG/3DAYS TD PT72: 1.0000 | MEDICATED_PATCH | Freq: Once | TRANSDERMAL | Status: DC

## 2014-12-22 MED ORDER — PHENYLEPHRINE 8 MG IN D5W 100 ML (0.08MG/ML) PREMIX OPTIME
INJECTION | INTRAVENOUS | Status: AC
  Filled 2014-12-22: qty 100

## 2014-12-22 MED ORDER — FENTANYL CITRATE 0.05 MG/ML IJ SOLN
INTRAMUSCULAR | Status: DC | PRN
  Administered 2014-12-22: 10 ug via INTRAVENOUS

## 2014-12-22 MED ORDER — SIMETHICONE 80 MG PO CHEW
80.0000 mg | CHEWABLE_TABLET | Freq: Three times a day (TID) | ORAL | Status: DC
  Administered 2014-12-23 – 2014-12-24 (×3): 80 mg via ORAL
  Filled 2014-12-22 (×4): qty 1

## 2014-12-22 MED ORDER — LANOLIN HYDROUS EX OINT: 1.0000 "application " | TOPICAL_OINTMENT | CUTANEOUS | Status: DC | PRN

## 2014-12-22 MED ORDER — FENTANYL CITRATE 0.05 MG/ML IJ SOLN: 25.0000 ug | INTRAMUSCULAR | Status: DC | PRN

## 2014-12-22 MED ORDER — LACTATED RINGERS IV SOLN
INTRAVENOUS | Status: DC | PRN
  Administered 2014-12-22: 15:00:00 via INTRAVENOUS

## 2014-12-22 MED ORDER — TETANUS-DIPHTH-ACELL PERTUSSIS 5-2.5-18.5 LF-MCG/0.5 IM SUSP: 0.5000 mL | Freq: Once | INTRAMUSCULAR | Status: DC

## 2014-12-22 MED ORDER — MORPHINE SULFATE 0.5 MG/ML IJ SOLN
INTRAMUSCULAR | Status: AC
  Filled 2014-12-22: qty 10

## 2014-12-22 MED ORDER — PRENATAL MULTIVITAMIN CH
1.0000 | ORAL_TABLET | Freq: Every day | ORAL | Status: DC
  Administered 2014-12-23 – 2014-12-24 (×2): 1 via ORAL
  Filled 2014-12-22 (×2): qty 1

## 2014-12-22 MED ORDER — ONDANSETRON HCL 4 MG PO TABS: 4.0000 mg | ORAL_TABLET | ORAL | Status: DC | PRN

## 2014-12-22 MED ORDER — DIPHENHYDRAMINE HCL 25 MG PO CAPS: 25.0000 mg | ORAL_CAPSULE | Freq: Four times a day (QID) | ORAL | Status: DC | PRN

## 2014-12-22 MED ORDER — NALOXONE HCL 0.4 MG/ML IJ SOLN: 0.4000 mg | INTRAMUSCULAR | Status: DC | PRN

## 2014-12-22 MED ORDER — OXYTOCIN 40 UNITS IN LACTATED RINGERS INFUSION - SIMPLE MED: 62.5000 mL/h | INTRAVENOUS | Status: AC

## 2014-12-22 MED ORDER — FENTANYL CITRATE 0.05 MG/ML IJ SOLN
INTRAMUSCULAR | Status: AC
  Filled 2014-12-22: qty 2

## 2014-12-22 MED ORDER — ONDANSETRON HCL 4 MG/2ML IJ SOLN: 4.0000 mg | Freq: Three times a day (TID) | INTRAMUSCULAR | Status: DC | PRN

## 2014-12-22 SURGICAL SUPPLY — 36 items
APL SKNCLS STERI-STRIP NONHPOA (GAUZE/BANDAGES/DRESSINGS) ×1
BENZOIN TINCTURE PRP APPL 2/3 (GAUZE/BANDAGES/DRESSINGS) ×3 IMPLANT
CLAMP CORD UMBIL (MISCELLANEOUS) IMPLANT
CLOSURE WOUND 1/2 X4 (GAUZE/BANDAGES/DRESSINGS) ×1
CLOTH BEACON ORANGE TIMEOUT ST (SAFETY) ×3 IMPLANT
DRAPE SHEET LG 3/4 BI-LAMINATE (DRAPES) IMPLANT
DRSG OPSITE POSTOP 4X10 (GAUZE/BANDAGES/DRESSINGS) ×3 IMPLANT
DURAPREP 26ML APPLICATOR (WOUND CARE) ×3 IMPLANT
ELECT REM PT RETURN 9FT ADLT (ELECTROSURGICAL) ×3
ELECTRODE REM PT RTRN 9FT ADLT (ELECTROSURGICAL) ×1 IMPLANT
EXTRACTOR VACUUM KIWI (MISCELLANEOUS) IMPLANT
GLOVE BIO SURGEON ST LM GN SZ9 (GLOVE) ×3 IMPLANT
GLOVE BIOGEL PI IND STRL 9 (GLOVE) ×1 IMPLANT
GLOVE BIOGEL PI INDICATOR 9 (GLOVE) ×2
GOWN STRL REUS W/TWL 2XL LVL3 (GOWN DISPOSABLE) ×3 IMPLANT
GOWN STRL REUS W/TWL LRG LVL3 (GOWN DISPOSABLE) ×3 IMPLANT
NEEDLE HYPO 25X5/8 SAFETYGLIDE (NEEDLE) IMPLANT
NS IRRIG 1000ML POUR BTL (IV SOLUTION) ×3 IMPLANT
PACK C SECTION WH (CUSTOM PROCEDURE TRAY) ×3 IMPLANT
PAD ABD 8X7 1/2 STERILE (GAUZE/BANDAGES/DRESSINGS) ×3 IMPLANT
PAD OB MATERNITY 4.3X12.25 (PERSONAL CARE ITEMS) ×3 IMPLANT
RTRCTR C-SECT PINK 25CM LRG (MISCELLANEOUS) ×3 IMPLANT
RTRCTR C-SECT PINK 34CM XLRG (MISCELLANEOUS) IMPLANT
STRIP CLOSURE SKIN 1/2X4 (GAUZE/BANDAGES/DRESSINGS) ×2 IMPLANT
SUT MNCRL 0 VIOLET CTX 36 (SUTURE) ×2 IMPLANT
SUT MONOCRYL 0 CTX 36 (SUTURE) ×4
SUT PLAIN 2 0 XLH (SUTURE) ×3 IMPLANT
SUT VIC AB 0 CT1 27 (SUTURE) ×3
SUT VIC AB 0 CT1 27XBRD ANBCTR (SUTURE) ×1 IMPLANT
SUT VIC AB 2-0 CT1 27 (SUTURE) ×3
SUT VIC AB 2-0 CT1 TAPERPNT 27 (SUTURE) ×1 IMPLANT
SUT VIC AB 4-0 KS 27 (SUTURE) ×3 IMPLANT
SYR BULB IRRIGATION 50ML (SYRINGE) IMPLANT
TAPE CLOTH SURG 4X10 WHT LF (GAUZE/BANDAGES/DRESSINGS) ×3 IMPLANT
TOWEL OR 17X24 6PK STRL BLUE (TOWEL DISPOSABLE) ×3 IMPLANT
TRAY FOLEY CATH 14FR (SET/KITS/TRAYS/PACK) ×3 IMPLANT

## 2014-12-22 NOTE — Lactation Note (Signed)
This note was copied from the chart of Boy Eliseo Gumustin Gaynor. Lactation Consultation Note Follow up visit at 8 hours of age.  Baby finished 20 minute feeding on right breast with nipple shield.  Colostrum noted on shield, breast and babies face.  #20 NS no fitting well, hand pump used to help evert nipple and not filling NS well attempted #24 and #16 with #16 a better fit, but baby fussy and didn't latch so asked MBU RN to assess for comfort with feedings.  Noted in chart mom has PCOS due to this and NS use asked [facility] RN, Ashely to set us DEBP for post pumping.   Baby became very fussy diaper changed with large stool and did not calm well for feedings.  Swaddled and handed to mom to comfort.  [facility] resources given and discussed.  Encouraged to feed with early cues on demand.  Early newborn behavior discussed.  Hand expression demonstrated with colostrum visible.  [facility].  Mom to call for assist as needed.     Patient Name: Boy Eliseo Gumustin Gilkey RUEAV'WToday's Date: 12/22/2014 Reason for consult: Initial assessment   Maternal Data Has patient been taught Hand Expression?: Yes Does the patient have breastfeeding experience prior to this delivery?: No  Feeding Feeding Type: Breast Fed Length of feed:  (few sucks)  LATCH Score/Interventions Latch: Repeated attempts needed to sustain latch, nipple held in mouth throughout feeding, stimulation needed to elicit sucking reflex.  Audible Swallowing: None  Type of Nipple: Flat  Comfort (Breast/Nipple): Soft / non-tender     Hold (Positioning): Assistance needed to correctly position infant at breast and maintain latch.  LATCH Score: 5  Lactation Tools Discussed/Used Tools: Nipple Shields Nipple shield size: 16;20;24 Date initiated:: 12/22/14   Consult Status Consult Status: Follow-up Date: 12/23/14 Follow-up type: In-patient    Jannifer RodneyShoptaw, Nyla Creason Lynn 12/22/2014, 11:29 PM

## 2014-12-22 NOTE — Anesthesia Postprocedure Evaluation (Signed)
  Anesthesia Post-op Note  Patient: Megan Hardin  Procedure(s) Performed: Procedure(s) (LRB): CESAREAN SECTION PRIMARY (N/A)  Patient Location: PACU  Anesthesia Type: Spinal  Level of Consciousness: awake and alert   Airway and Oxygen Therapy: Patient Spontanous Breathing  Post-op Pain: mild  Post-op Assessment: Post-op Vital signs reviewed, Patient's Cardiovascular Status Stable, Respiratory Function Stable, Patent Airway and No signs of Nausea or vomiting  Last Vitals:  Filed Vitals:   12/22/14 1600  BP: 99/63  Pulse: 84  Temp:   Resp: 24    Post-op Vital Signs: stable   Complications: No apparent anesthesia complications

## 2014-12-22 NOTE — Anesthesia Preprocedure Evaluation (Addendum)
Anesthesia Evaluation  Patient identified by MRN, date of birth, ID band Patient awake    Reviewed: Allergy & Precautions, NPO status , Patient's Chart, lab work & pertinent test results  History of Anesthesia Complications Negative for: history of anesthetic complications  Airway Mallampati: III  TM Distance: >3 FB Neck ROM: Full    Dental no notable dental hx. (+) Dental Advisory Given   Pulmonary shortness of breath and with exertion, former smoker,  breath sounds clear to auscultation  Pulmonary exam normal       Cardiovascular hypertension (gHTN), Rhythm:Regular Rate:Normal     Neuro/Psych negative neurological ROS  negative psych ROS   GI/Hepatic negative GI ROS, Neg liver ROS,   Endo/Other  diabetes, Type 2, Oral Hypoglycemic AgentsMorbid obesity  Renal/GU negative Renal ROS  negative genitourinary   Musculoskeletal negative musculoskeletal ROS (+)   Abdominal   Peds negative pediatric ROS (+)  Hematology  (+) anemia ,   Anesthesia Other Findings   Reproductive/Obstetrics (+) Pregnancy                            Anesthesia Physical Anesthesia Plan  ASA: III  Anesthesia Plan: Spinal   Post-op Pain Management:    Induction:   Airway Management Planned:   Additional Equipment:   Intra-op Plan:   Post-operative Plan:   Informed Consent: I have reviewed the patients History and Physical, chart, labs and discussed the procedure including the risks, benefits and alternatives for the proposed anesthesia with the patient or authorized representative who has indicated his/her understanding and acceptance.   Dental advisory given  Plan Discussed with: CRNA  Anesthesia Plan Comments:         Anesthesia Quick Evaluation

## 2014-12-22 NOTE — Brief Op Note (Signed)
12/22/2014  3:10 PM  PATIENT:  Megan Hardin  34 y.o. female  PRE-OPERATIVE DIAGNOSIS:  primary c-section, breech, 39 wk  POST-OPERATIVE DIAGNOSIS:  primary c-section, breech Delivered  PROCEDURE:  Procedure(s): CESAREAN SECTION PRIMARY (N/A)  SURGEON:  Surgeon(s) and Role:    * Tilda BurrowJohn Maureen Duesing V, MD - Primary  PHYSICIAN ASSISTANT:   ASSISTANTS: none   ANESTHESIA:   spinal  EBL:  Total I/O In: 3000 [I.V.:3000] Out: 730 [Urine:30; Blood:700]  BLOOD ADMINISTERED:none  DRAINS: Urinary Catheter (Foley)   LOCAL MEDICATIONS USED:  NONE  SPECIMEN:  Source of Specimen:  placenta to L&D  DISPOSITION OF SPECIMEN:  l&D  COUNTS:  YES  TOURNIQUET:  * No tourniquets in log *  DICTATION: .Dragon Dictation  PLAN OF CARE: Admit to inpatient   PATIENT DISPOSITION:  PACU - hemodynamically stable.   Delay start of Pharmacological VTE agent (>24hrs) due to surgical blood loss or risk of bleeding: not applicable

## 2014-12-22 NOTE — Op Note (Signed)
12/22/2014  3:10 PM  PATIENT:  Megan Hardin  34 y.o. female  PRE-OPERATIVE DIAGNOSIS:  primary c-section, breech, 39 wk  POST-OPERATIVE DIAGNOSIS:  primary c-section, breech Delivered  PROCEDURE:  Procedure(s): CESAREAN SECTION PRIMARY (N/A)  SURGEON:  Surgeon(s) and Role:    * Tilda BurrowJohn Braeson Rupe V, MD - Primary  PHYSICIAN ASSISTANT:   ASSISTANTS: none   ANESTHESIA:   spinal  EBL:  Total I/O In: 3000 [I.V.:3000] Out: 730 [Urine:30; Blood:700]  BLOOD ADMINISTERED:none  DRAINS: Urinary Catheter (Foley)   LOCAL MEDICATIONS USED:  NONE  SPECIMEN:  Source of Specimen:  placenta to L&D  DISPOSITION OF SPECIMEN:  l&D  COUNTS:  YES  TOURNIQUET:  * No tourniquets in log *  DICTATION: .Dragon Dictation  PLAN OF CARE: Admit to inpatient   PATIENT DISPOSITION:  PACU - hemodynamically stable.   Delay start of Pharmacological VTE agent (>24hrs) due to surgical blood loss or risk of bleeding: not applicable Details of procedure: Patient was taken to the operating room, prepped and draped for lower abdominal surgery with the generous pannus taped upward from the umbilicus to beneath the rib cage using wide strips of tape which gave much improved view of the lower abdomen after prepping and draping with Foley catheter in place, timeout was conducted, and the procedure confirmed as a primary cesarean section. Transverse lower abdominal incision was made and the method of Pfannenstiel, sharp dissection down the fascia, opening the fascia transversely and blunt entry of the abdominal cavity using finger dissection, then the peritoneum opened sharply in the midline to allow access to the uterus. Alexis wound retractor was placed in position. Bladder flap was developed a lower uterine segment and transverse uterine incision performed in standard fashion with fetal buttocks and genitalia immediately visible. The infant was delivered by standard breech extraction buttocks, ten-day legs which  were flexed upon accessibility, and then delivery of the body, sweeping the arms in front of the face and in easy delivery of the fetal vertex. Cord was clamped and the infant passed to the pediatrician see their notes for details. Cord blood samples were obtained and the placenta delivered with some difficulty. The uterus had been exteriorized to get the head delivered. The backside of the uterus was inspected and there was no evidence of disruption or damage to the uterine body. Uterus was swabbed out, then closed with running locking 0 Monocryl first layer and oversewing continuous running second layer. Hemostasis was good. Abdomen was irrigated. Uterus was returned to its position. Anterior peritoneum was closed using 2-0 Vicryl, the fascia closed with 0 Vicryl, subcutaneous fatty tissue reapproximated with interrupted 20 plain a large needle, then subcuticular 4-0 Vicryl close skin incision. Sponge and needle counts were correct

## 2014-12-22 NOTE — Anesthesia Procedure Notes (Signed)
Spinal Patient location during procedure: OR Start time: 12/22/2014 2:00 PM End time: 12/22/2014 2:05 PM Staffing Anesthesiologist: Megan Hardin, Megan Hardin Performed by: anesthesiologist  Preanesthetic Checklist Completed: patient identified, site marked, surgical consent, pre-op evaluation, timeout performed, IV checked, risks and benefits discussed and monitors and equipment checked Spinal Block Patient position: sitting Prep: DuraPrep Patient monitoring: continuous pulse ox, blood pressure and heart rate Approach: midline Location: L3-4 Injection technique: single-shot Needle Needle type: Sprotte  Needle gauge: 24 G Needle length: 9 cm Assessment Sensory level: T4 Additional Notes Functioning IV was confirmed and monitors were applied. Sterile prep and drape, including hand hygiene, mask and sterile gloves were used. The patient was positioned and the spine was prepped. The skin was anesthetized with lidocaine.  Free flow of clear CSF was obtained prior to injecting local anesthetic into the CSF.  The spinal needle aspirated freely following injection.  The needle was carefully withdrawn.  The patient tolerated the procedure well. Consent was obtained prior to procedure with all questions answered and concerns addressed. Risks including but not limited to bleeding, infection, nerve damage, paralysis, failed block, inadequate analgesia, allergic reaction, high spinal, itching and headache were discussed and the patient wished to proceed.   Megan SchwalbeMary Bosten Newstrom, MD

## 2014-12-22 NOTE — Transfer of Care (Signed)
Immediate Anesthesia Transfer of Care Note  Patient: Megan Hardin  Procedure(s) Performed: Procedure(s): CESAREAN SECTION PRIMARY (N/A)  Patient Location: PACU  Anesthesia Type:Spinal  Level of Consciousness: awake, alert  and oriented  Airway & Oxygen Therapy: Patient Spontanous Breathing  Post-op Assessment: Report given to PACU RN and Post -op Vital signs reviewed and stable  Post vital signs: Reviewed and stable  Complications: No apparent anesthesia complications

## 2014-12-22 NOTE — H&P (Signed)
Megan Hardin is a 34 y.o. female presenting for Primary Cesarean for breech. U/s again confirms breech immediately before going to OR. Pt declined ECV effort. Prenatal course notable for obesity, A2DM, CHTN, with NST"S reactive. Maternal Medical History:  Contractions: Onset was 3-5 hours ago.      OB History    Gravida Para Term Preterm AB TAB SAB Ectopic Multiple Living   2    1  1         Past Medical History  Diagnosis Date  . PCOS (polycystic ovarian syndrome)   . Miscarriage   . Obesity   . Pregnant 04/30/2014  . Shortness of breath dyspnea     on exertion due to weight  . Gestational diabetes mellitus, antepartum    Past Surgical History  Procedure Laterality Date  . Eye surgery Left   . Dilation and curettage of uterus N/A 02/05/2014    Procedure: CERVICAL DILATATION AND SUCTION/SHARP UTERINE EVACUATION;  Surgeon: Lazaro ArmsLuther H Eure, MD;  Location: [facility];  Service: Gynecology;  Laterality: N/A;   Family History: family history includes Alzheimer's disease in her maternal grandfather; Diabetes in her maternal grandfather, maternal grandmother, and mother; Hypertension in her father; Other in her paternal grandmother. Social History:  reports that she has quit smoking. Her smoking use included Cigarettes. She has never used smokeless tobacco. She reports that she does not drink alcohol or use illicit drugs.   Prenatal Transfer Tool  Maternal Diabetes: Yes:  Diabetes Type:  Insulin/Medication controlled Genetic Screening: Normal Maternal Ultrasounds/Referrals: Normal Fetal Ultrasounds or other Referrals:  None Maternal Substance Abuse:  No Significant Maternal Medications:  Meds include: Other:  Significant Maternal Lab Results:  Lab values include: Group B Strep positive Other Comments:  Labetalol 200 bid  ROS    Blood pressure 163/87, pulse 86, last menstrual period 01/11/2014, SpO2 98 %. Exam Physical Exam  Constitutional: She is oriented to person, place, and time.  She appears well-developed and well-nourished.  HENT:  Head: Normocephalic.  Eyes: Pupils are equal, round, and reactive to light.  Neck: Normal range of motion.  Cardiovascular: Normal rate.   Respiratory: Effort normal.  GI: Soft.  Gravid uterus, breech.  Genitourinary: Vagina normal.  Musculoskeletal: Normal range of motion.  Neurological: She is alert and oriented to person, place, and time. She has normal reflexes.  Psychiatric: She has a normal mood and affect. Her behavior is normal. Judgment and thought content normal.    Prenatal labs: ABO, Rh: --/--/O POS, O POS (01/15 1230) Antibody: NEG (01/15 1230) Rubella: 0.96 (06/30 0946) RPR: Non Reactive (01/15 1230)  HBsAg: NEGATIVE (06/30 0946)  HIV: NONREACTIVE (11/03 1142)  GBS: Detected (12/29 1520)   Assessment/Plan: Pregnancy 39 wk frank Breech, declined ecv A2DM Chtn Plan  Primary cesarean section.   Megan Hardin V 12/22/2014, 1:44 PM

## 2014-12-23 ENCOUNTER — Encounter (HOSPITAL_COMMUNITY): Payer: Self-pay | Admitting: Obstetrics and Gynecology

## 2014-12-23 LAB — CBC
HEMATOCRIT: 29.4 % — AB (ref 36.0–46.0)
Hemoglobin: 9.7 g/dL — ABNORMAL LOW (ref 12.0–15.0)
MCH: 30.4 pg (ref 26.0–34.0)
MCHC: 33 g/dL (ref 30.0–36.0)
MCV: 92.2 fL (ref 78.0–100.0)
Platelets: 326 10*3/uL (ref 150–400)
RBC: 3.19 MIL/uL — AB (ref 3.87–5.11)
RDW: 14.1 % (ref 11.5–15.5)
WBC: 11.5 10*3/uL — AB (ref 4.0–10.5)

## 2014-12-23 LAB — BIRTH TISSUE RECOVERY COLLECTION (PLACENTA DONATION)

## 2014-12-23 MED ORDER — SODIUM CHLORIDE 0.9 % IV BOLUS (SEPSIS)
1000.0000 mL | Freq: Once | INTRAVENOUS | Status: AC
  Administered 2014-12-23: 1000 mL via INTRAVENOUS

## 2014-12-23 NOTE — Lactation Note (Signed)
This note was copied from the chart of Boy Eliseo Gumustin Rabine. Lactation Consultation Note  Mother has history of PCOS. Mother attempted latching without NS but baby did not sustain latch. Assisted mother in both cross cradle and football hold with #16NS.  Prefilled NS with formula. Baby sucked on and off for 10 min with breast massage and stimulation.  Baby very sleepy at breast. Encouraged lots of STS. Plan is for mother to prefill NS with either pumped breastmilk or formula.  Breastfeed baby using massage to keep him active. Then post pump 15-20 min with DEBP.  Give baby volume pumped at next feeding. Reviewed how to use foley cup and spoon. Discussed plan with Hoy FinlayEllen RN.  If baby does not have successful feed with next feeding then should start being supplemented w/ formula after each feeding. Discussed milk storage and cluster feeding.   Patient Name: Boy Eliseo Gumustin Balogh JYNWG'NToday's Date: 12/23/2014 Reason for consult: Follow-up assessment   Maternal Data    Feeding Feeding Type: Breast Fed Length of feed: 10 min (off and on)  LATCH Score/Interventions Latch: Repeated attempts needed to sustain latch, nipple held in mouth throughout feeding, stimulation needed to elicit sucking reflex. Intervention(s): Adjust position;Assist with latch;Breast massage  Audible Swallowing: A few with stimulation  Type of Nipple: Flat Intervention(s): Double electric pump  Comfort (Breast/Nipple): Soft / non-tender     Hold (Positioning): Assistance needed to correctly position infant at breast and maintain latch.  LATCH Score: 6  Lactation Tools Discussed/Used Tools: Nipple Shields Nipple shield size: 16   Consult Status Consult Status: Follow-up Date: 12/24/14 Follow-up type: In-patient    Dahlia ByesBerkelhammer, Hurley Blevins [facility] 12/23/2014, 2:27 PM

## 2014-12-23 NOTE — Progress Notes (Signed)
Post Partum Day 1  Subjective: no complaints, up ad lib, tolerating PO and + flatus   Ms. Celso Amyustin C Labombard  is a 34 y.o.  G2P1011  who delivered a boy at 7210w2d  on 12/22/2014 via PLTCS 2/2 breech presentation. VSS, has done well overnight. Ambulating well, tolerating PO liquids and solids, and passing flatus. No BM yet. Low urine output, foley catheter still in place. Incision covered in dressing, no tenderness, edema or drainage noted.    Objective: Blood pressure 132/70, pulse 92, temperature 98.3 F (36.8 C), temperature source Oral, resp. rate 20, last menstrual period 01/11/2014, SpO2 99 %, currently breastfeeding.  Physical Exam:  General: alert, cooperative, appears stated age, no distress and morbidly obese Lochia: appropriate Uterine Fundus: firm (difficult to palpate) Incision: no significant drainage, no significant erythema. Dressing in place, no tenderness, edema or drainage noted.  DVT Evaluation: No evidence of DVT seen on physical exam. No cords or calf tenderness. No significant calf/ankle edema.   Recent Labs  12/23/14 0545  HGB 9.7*  HCT 29.4*    Assessment/Plan: Breastfeeding and Contraception possibly IUD. Patient wishes for a reversible MOC 2/2 family planning in the next 2-3 years. Discussed IUD, but hx of PCOS. Advised to discuss further with primary OB/GYN.  Infant will be circumcised OP.    LOS: 1 day   Cary Wilford, [name] M 12/23/2014, 7:49 AM

## 2014-12-23 NOTE — Addendum Note (Signed)
Addendum  created 12/23/14 0735 by Yolonda KidaAlison L Maryum Batterson, CRNA   Modules edited: Notes Section   Notes Section:  File: [id number]

## 2014-12-23 NOTE — Progress Notes (Signed)
Pt urine output less than 30cc/hr.  MD on call, Dr. Ethelda Chickaroline Roberts, notified. 1 liter bolus at 94099ml/hr x 1 dose ordered.  Will continue to monitor.

## 2014-12-23 NOTE — Anesthesia Postprocedure Evaluation (Signed)
  Anesthesia Post-op Note  Patient: Megan Hardin  Procedure(s) Performed: Procedure(s): CESAREAN SECTION PRIMARY (N/A)  Patient Location: Mother/Baby  Anesthesia Type:Spinal  Level of Consciousness: awake, alert , oriented and patient cooperative  Airway and Oxygen Therapy: Patient Spontanous Breathing  Post-op Pain: mild  Post-op Assessment: Post-op Vital signs reviewed, Patient's Cardiovascular Status Stable, Respiratory Function Stable, Patent Airway, No headache, No backache, No residual numbness and No residual motor weakness  Post-op Vital Signs: Reviewed and stable  Last Vitals:  Filed Vitals:   12/23/14 0400  BP: 132/70  Pulse: 92  Temp: 36.8 C  Resp: 20    Complications: No apparent anesthesia complications

## 2014-12-24 MED ORDER — HYDROCHLOROTHIAZIDE 12.5 MG PO TABS: 12.5000 mg | ORAL_TABLET | Freq: Every day | ORAL | Status: DC

## 2014-12-24 MED ORDER — OXYCODONE-ACETAMINOPHEN 5-325 MG PO TABS: 1.0000 | ORAL_TABLET | ORAL | Status: DC | PRN

## 2014-12-24 NOTE — Progress Notes (Signed)
Post Partum Day 2 Subjective: no complaints, up ad lib, voiding, tolerating PO and + flatus   Megan Hardin  is a 34 y.o.  G2P1011  who delivered a boy at 1221w2d  on 12/22/2014 via PLTCS 2/2 breech presentation. VSS, has done well overnight. Ambulating well, tolerating PO liquids and solids, voiding on her own with foley cath removed and passing flatus. No BM yet. Pain at 3/10 and tolerable.   Objective: Blood pressure 143/74, pulse 86, temperature 98 F (36.7 C), temperature source Oral, resp. rate 20, last menstrual period 01/11/2014, SpO2 96 %, currently breastfeeding.  Physical Exam:  General: alert, cooperative, appears stated age, no distress and morbidly obese Lochia: appropriate Uterine Fundus: firm, difficult to palpate 2/2 morbid obesity Incision: no dehiscence, see through dressing in place with moist bloody seepage areas on bilateral lateral edges; incision is located under pannus DVT Evaluation: No evidence of DVT seen on physical exam. No cords or calf tenderness. Calf/Ankle edema is present. Nonpitting.    Recent Labs  12/23/14 0545  HGB 9.7*  HCT 29.4*    Assessment/Plan: Plan for discharge tomorrow, Breastfeeding and Contraception : considering IUD.   Patient encouraged to ambulate to help with LE edema.    LOS: 2 days   [name], Megan Hardin M 12/24/2014, 7:27 AM

## 2014-12-24 NOTE — Discharge Summary (Signed)
Obstetric Discharge Summary Reason for Admission: cesarean section Prenatal Procedures: none Intrapartum Procedures: cesarean: low cervical, transverse Postpartum Procedures: none Complications-Operative and Postpartum: none HEMOGLOBIN  Date Value Ref Range Status  12/23/2014 9.7* 12.0 - 15.0 g/dL Final   HCT  Date Value Ref Range Status  12/23/2014 29.4* 36.0 - 46.0 % Final    Delivery:  [name] is a 34 y.o. G2P1011 who delivered a boy at 7014w2d on 12/22/2014 via PLTCS 2/2 breech presentation (refused EVS). Pregnancy was complicated by morbid obesity, cHTN and A2DM. CS completed without complications. Apgars: 9, 9. Weight: 8 lb 13.4 oz (4009 g).   Today: VSS, has done well overnight. Ambulating well, tolerating PO liquids and solids, voiding on her own with foley cath removed and passing flatus. No BM yet. Pain at 3/10 and tolerable.   Incision dressing moist with some bloody discharge on lateral edges. Incision located under pannus. Placed PICO dressing today to prevent infection and improve healing.   Breastfeeding, son will be circumcised OP, and will use IUD for contraception.   Physical Exam:  General: alert, cooperative, appears stated age, no distress and morbidly obese Lochia: appropriate Uterine Fundus: firm Incision: no dehiscence, some moisture and bloody discharge present. PICO dressing in place DVT Evaluation: No evidence of DVT seen on physical exam. No cords or calf tenderness. Minimal calf/ankle edema, nonpitting.  Discharge Diagnoses: Term Pregnancy-delivered  Discharge Information: Date: 12/24/2014 Activity: unrestricted Diet: routine Medications: Percocet Condition: stable Instructions: refer to practice specific booklet Discharge to: home   Newborn Data: Live born female  Birth Weight: 8 lb 13.4 oz (4009 g) APGAR: 9, 9  Home with mother.  Megan Hardin, Megan Hardin 12/24/2014, 11:25 AM

## 2014-12-24 NOTE — Lactation Note (Signed)
This note was copied from the chart of Megan Hardin Geddes. Lactation Consultation Note  Baby cueing.  Mother placed baby in football hold and baby latched.  Had mother reposition baby for more depth. Instructed mother that if baby is not on deep on her breast, he will not be transferring enough breastmilk. Suggest she use #20NS if baby is unable to sustain latch. Baby sucked, some swallows observed.  Baby breastfed for 8 min and fell asleep.  LS6. Suggest FOB give baby 10 ml of pumped breastmilk with slow flow nipple. Plan is for mother to breastfeed.  Then pump and give baby back volume pumped with slow flow nipple.  Provided volume guidelines and parents will give the difference in formula from what is pumped. Encouraged them to be sure to wake baby if he sleeps for longer than 3 hours to feed since he has been so sleepy.   Patient Name: Megan Hardin Treese ZOXWR'UToday's Date: 12/24/2014 Reason for consult: Follow-up assessment   Maternal Data    Feeding Feeding Type: Breast Fed Length of feed: 8 min  LATCH Score/Interventions Latch: Repeated attempts needed to sustain latch, nipple held in mouth throughout feeding, stimulation needed to elicit sucking reflex.  Audible Swallowing: A few with stimulation Intervention(s): Skin to skin  Type of Nipple: Flat Intervention(s): Hand pump  Comfort (Breast/Nipple): Filling, red/small blisters or bruises, mild/mod discomfort  Problem noted: Mild/Moderate discomfort Interventions (Mild/moderate discomfort): Comfort gels;Hand expression  Hold (Positioning): No assistance needed to correctly position infant at breast. Intervention(s): Support Pillows  LATCH Score: 6  Lactation Tools Discussed/Used Tools: Nipple Dorris CarnesShields   Consult Status Consult Status: Follow-up Date: 12/25/14 Follow-up type: In-patient    Dahlia ByesBerkelhammer, Ruth [facility] 12/24/2014, 2:41 PM

## 2014-12-25 ENCOUNTER — Ambulatory Visit: Payer: Self-pay

## 2014-12-25 ENCOUNTER — Telehealth: Payer: Self-pay | Admitting: *Deleted

## 2014-12-25 NOTE — Lactation Note (Signed)
This note was copied from the chart of Boy Eliseo Gumustin Kessner. Lactation Consultation Note  Patient Name: Boy Eliseo Gumustin Erler WGNFA'OToday's Date: 12/25/2014 Reason for consult: Follow-up assessment  Baby is 2768 hours old , and per mom and LC assisting with latch has improved with latch.  LC assisted mom with latch without the Nipple shield and worked on depth , and added 5 F feeding tube with EBM . Baby latched and fed 10 mins , two  different latches and sustained the depth. LC recommended to mom to work on latching and use the 5 F feeding tube with EBM. Baby seemed satisfied after feeding and fell asleep. [facility] , and is waiting for a call back regarding a DEBP . Moms milk is in. LC reviewed sore nipple and engorgement prevention and tx. Referring to page 24 -25 in the Baby and me booklet. LC recommended in within the next week for mom to call back [facility] to check on milk supply due to PCOS, even- though mom has had good breast changes with pregnancy. Mother informed of post-discharge support and given phone number to the lactation department, including services for phone call assistance; out-patient appointments; and breastfeeding support group. List of other breastfeeding resources in the community given in the handout. Encouraged mother to call for problems or concerns related to breastfeeding.    Maternal Data Has patient been taught Hand Expression?: Yes  Feeding Feeding Type: Breast Milk Nipple Type: Slow - flow Length of feed: 10 min  LATCH Score/Interventions Latch: Grasps breast easily, tongue down, lips flanged, rhythmical sucking. Intervention(s): Adjust position;Assist with latch;Breast massage;Breast compression  Audible Swallowing: Spontaneous and intermittent  Type of Nipple: Everted at rest and after stimulation  Comfort (Breast/Nipple): Filling, red/small blisters or bruises, mild/mod discomfort  Problem noted: Filling  Hold  (Positioning): Assistance needed to correctly position infant at breast and maintain latch. Intervention(s): Breastfeeding basics reviewed;Support Pillows;Position options;Skin to skin  LATCH Score: 8  Lactation Tools Discussed/Used Tools: 45F feeding tube / Syringe Breast pump type: Double-Electric Breast Pump WIC Program: Yes ([facility] , mom has called to check on a DEBP ) Pump Review: Milk Storage Initiated by:: MAI  Date initiated:: 12/25/14   Consult Status Consult Status: Follow-up Date: 12/25/14 ([facility] ) Follow-up type: In-patient    Kathrin Greathouseorio, Daneya Hartgrove Ann 12/25/2014, 10:52 AM

## 2014-12-25 NOTE — Telephone Encounter (Signed)
[facility] 12/22/2014 HGB today 10.0.

## 2014-12-29 ENCOUNTER — Encounter: Payer: Self-pay | Admitting: Obstetrics & Gynecology

## 2014-12-29 ENCOUNTER — Ambulatory Visit: Payer: Medicaid Other | Admitting: Obstetrics & Gynecology

## 2014-12-29 ENCOUNTER — Ambulatory Visit (INDEPENDENT_AMBULATORY_CARE_PROVIDER_SITE_OTHER): Payer: Medicaid Other | Admitting: Obstetrics & Gynecology

## 2014-12-29 VITALS — BP 110/80 | Wt 347.4 lb

## 2014-12-29 DIAGNOSIS — Z9889 Other specified postprocedural states: Secondary | ICD-10-CM

## 2014-12-29 NOTE — Progress Notes (Signed)
Patient ID: Megan Hardin, female   DOB: [birth year], 34 y.o.   MRN: [id number]  HPI: Patient returns for routine postoperative follow-up having undergone primary caesarean section 12/22/2014  The patient's early postoperative recovery while in the hospital was notable for uncomplicated. Since hospital discharge the patient reports no problems.   Current Outpatient Prescriptions  Medication Sig Dispense Refill  . aspirin 81 MG tablet Take 81 mg by mouth daily.    Marland Kitchen. oxyCODONE-acetaminophen (PERCOCET/ROXICET) 5-325 MG per tablet Take 1 tablet by mouth every 4 (four) hours as needed (for pain scale less than 7). 50 tablet 0  . Prenatal Multivit-Min-Fe-FA (PRENATAL VITAMINS PO) Take 1 tablet by mouth daily.    . butalbital-acetaminophen-caffeine (FIORICET) 50-325-40 MG per tablet Take 1 tablet by mouth every 4 (four) hours as needed for headache or migraine. (Patient not taking: Reported on 12/29/2014) 20 tablet 0  . hydrochlorothiazide (HYDRODIURIL) 12.5 MG tablet Take 1 tablet (12.5 mg total) by mouth daily. (Patient not taking: Reported on 12/29/2014) 30 tablet 2  . metFORMIN (GLUCOPHAGE) 500 MG tablet Take 2 tablets (1,000 mg total) by mouth 2 (two) times daily with a meal. (Patient not taking: Reported on 12/29/2014) 60 tablet 11  . [DISCONTINUED] Doxylamine Succinate, Sleep, (SLEEP AID PO) Take 1 tablet by mouth at bedtime.      No current facility-administered medications for this visit.    Physical Exam: Incision clean dry intact  Diagnostic Tests:    Impression: normal post op course  Plan: Follow up 1 week to check incision

## 2015-01-05 ENCOUNTER — Encounter: Payer: Self-pay | Admitting: Obstetrics & Gynecology

## 2015-01-05 ENCOUNTER — Ambulatory Visit (INDEPENDENT_AMBULATORY_CARE_PROVIDER_SITE_OTHER): Payer: Medicaid Other | Admitting: Obstetrics & Gynecology

## 2015-01-05 VITALS — BP 140/80 | Wt 339.8 lb

## 2015-01-05 DIAGNOSIS — Z9889 Other specified postprocedural states: Secondary | ICD-10-CM

## 2015-01-19 ENCOUNTER — Ambulatory Visit: Payer: Medicaid Other | Admitting: Obstetrics & Gynecology

## 2015-01-27 ENCOUNTER — Encounter: Payer: Self-pay | Admitting: Advanced Practice Midwife

## 2015-01-27 ENCOUNTER — Ambulatory Visit: Payer: Medicaid Other | Admitting: Advanced Practice Midwife

## 2015-02-04 NOTE — Progress Notes (Signed)
Patient ID: Celso Amyustin C Sterry, female   DOB: February 11, 1981, 34 y.o.   MRN: [id number]  HPI: Patient returns for routine postoperative follow-up having undergone primary Caesarean section for breech presnetation on 12/22/2014 The patient's early postoperative recovery while in the hospital was notable for incisional yeast. Since hospital discharge the patient reports swelling otherwise no problems.   Current Outpatient Prescriptions  Medication Sig Dispense Refill  . aspirin 81 MG tablet Take 81 mg by mouth daily.    . butalbital-acetaminophen-caffeine (FIORICET) 50-325-40 MG per tablet Take 1 tablet by mouth every 4 (four) hours as needed for headache or migraine. 20 tablet 0  . Prenatal Multivit-Min-Fe-FA (PRENATAL VITAMINS PO) Take 1 tablet by mouth daily.    . hydrochlorothiazide (HYDRODIURIL) 12.5 MG tablet Take 1 tablet (12.5 mg total) by mouth daily. (Patient not taking: Reported on 12/29/2014) 30 tablet 2  . metFORMIN (GLUCOPHAGE) 500 MG tablet Take 2 tablets (1,000 mg total) by mouth 2 (two) times daily with a meal. (Patient not taking: Reported on 01/05/2015) 60 tablet 11  . oxyCODONE-acetaminophen (PERCOCET/ROXICET) 5-325 MG per tablet Take 1 tablet by mouth every 4 (four) hours as needed (for pain scale less than 7). (Patient not taking: Reported on 01/05/2015) 50 tablet 0  . [DISCONTINUED] Doxylamine Succinate, Sleep, (SLEEP AID PO) Take 1 tablet by mouth at bedtime.      No current facility-administered medications for this visit.    Physical Exam: Incision clean dry with some yeast gentian violet was applied  Diagnostic Tests: none  Impression: Normal post op C/S  Plan: Keep clean dry Follow up 1 month pp exam

## 2015-02-18 ENCOUNTER — Ambulatory Visit (INDEPENDENT_AMBULATORY_CARE_PROVIDER_SITE_OTHER): Payer: Medicaid Other | Admitting: Advanced Practice Midwife

## 2015-02-18 ENCOUNTER — Encounter: Payer: Self-pay | Admitting: Advanced Practice Midwife

## 2015-02-18 DIAGNOSIS — Z30014 Encounter for initial prescription of intrauterine contraceptive device: Secondary | ICD-10-CM

## 2015-02-18 DIAGNOSIS — IMO0001 Reserved for inherently not codable concepts without codable children: Secondary | ICD-10-CM

## 2015-02-18 NOTE — Progress Notes (Signed)
  Megan Hardin is a [age] y.o. who presents for a postpartum visit. She is 6 weeks postpartum following a low cervical transverse Cesarean section. I have fully reviewed the prenatal and intrapartum course. The delivery was at 39.2 gestational weeks. It was a primary cs for breech. Anesthesia: spinal. Postpartum course has been uneventful. Baby's course has been uneventful. Baby is feeding by breast and supplementing with formula. She has notice her supply is decreasing. Bleeding: no bleeding. Bowel function is normal. Bladder function is normal. Patient is sexually active. Contraception method is condoms. Postpartum depression screening: negative.  Checked fasting blood sugars for about 2 weeks, thought they were normal, so quit.  Thinks they were in the 100-110 range.  Has also stopped HCTZ.  Was thought to be CHTN based on early pregnancy BP's, but has never been dx  Current outpatient prescriptions:  .  Prenatal Multivit-Min-Fe-FA (PRENATAL VITAMINS PO), Take 1 tablet by mouth daily., Disp: , Rfl:  .  [DISCONTINUED] Doxylamine Succinate, Sleep, (SLEEP AID PO), Take 1 tablet by mouth at bedtime. , Disp: , Rfl:   Review of Systems   Constitutional: Negative for fever and chills Eyes: Negative for visual disturbances Respiratory: Negative for shortness of breath, dyspnea Cardiovascular: Negative for chest pain or palpitations  Gastrointestinal: Negative for vomiting, diarrhea and constipation Genitourinary: Negative for dysuria and urgency Musculoskeletal: Negative for back pain, joint pain, myalgias  Neurological: Negative for dizziness and headaches   Objective:     Filed Vitals:   02/18/15 1440  BP: 144/86  Pulse: 68   General:  alert, cooperative and no distress   Breasts:  negative  Lungs: clear to auscultation bilaterally  Heart:  regular rate and rhythm  Abdomen: Soft, nontender healing well   Vulva:  normal  Vagina: normal vagina  Cervix:  closed  Corpus: Well involuted     Rectal Exam: no hemorrhoids        Assessment:    normal postpartum exam. Class a2/b DM ? CHTN  Plan:    1. Contraception: IUD  2.  2hr gtt 3. Continue to monitor BP--meds prn 4. No sex for 10 days--qhcg morning of IUD  Follow up in: 1 week for IUD or as needed.

## 2015-02-25 ENCOUNTER — Other Ambulatory Visit: Payer: Medicaid Other

## 2015-02-25 ENCOUNTER — Ambulatory Visit: Payer: Medicaid Other | Admitting: Advanced Practice Midwife

## 2016-06-20 ENCOUNTER — Emergency Department (HOSPITAL_COMMUNITY)
Admission: EM | Admit: 2016-06-20 | Discharge: 2016-06-20 | Disposition: A | Discharge status: Home / Self Care | Payer: Medicaid Other | Attending: Emergency Medicine | Admitting: Emergency Medicine

## 2016-06-20 ENCOUNTER — Encounter (HOSPITAL_COMMUNITY): Payer: Self-pay | Admitting: Emergency Medicine

## 2016-06-20 DIAGNOSIS — J029 Acute pharyngitis, unspecified: Secondary | ICD-10-CM | POA: Insufficient documentation

## 2016-06-20 DIAGNOSIS — Z87891 Personal history of nicotine dependence: Secondary | ICD-10-CM | POA: Insufficient documentation

## 2016-06-20 LAB — RAPID STREP SCREEN (MED CTR MEBANE ONLY): STREPTOCOCCUS, GROUP A SCREEN (DIRECT): NEGATIVE

## 2016-06-20 MED ORDER — PREDNISONE 50 MG PO TABS
60.0000 mg | ORAL_TABLET | Freq: Once | ORAL | Status: AC
  Administered 2016-06-20: 60 mg via ORAL
  Filled 2016-06-20: qty 1

## 2016-06-20 MED ORDER — CLINDAMYCIN HCL 300 MG PO CAPS: 300.0000 mg | ORAL_CAPSULE | Freq: Four times a day (QID) | ORAL | Status: DC

## 2016-06-20 MED ORDER — HYDROCODONE-ACETAMINOPHEN 7.5-325 MG/15ML PO SOLN: 10.0000 mL | Freq: Four times a day (QID) | ORAL | Status: DC | PRN

## 2016-06-20 MED ORDER — PENICILLIN G BENZATHINE 1200000 UNIT/2ML IM SUSP
1.2000 10*6.[IU] | Freq: Once | INTRAMUSCULAR | Status: AC
  Administered 2016-06-20: 1.2 10*6.[IU] via INTRAMUSCULAR
  Filled 2016-06-20: qty 2

## 2016-06-20 NOTE — ED Provider Notes (Signed)
CSN: [id number]     Arrival date & time 06/20/16  1802 History  By signing my name below, I, Megan Hardin, attest that this documentation has been prepared under the direction and in the presence of Jameson Morrow, PA-C. Electronically Signed: Alyssa Hardin, ED Scribe. 06/20/2016. 7:36 PM.    Chief Complaint  Patient presents with  . Sore Throat  . Fever   The history is provided by the patient. No language interpreter was used.    HPI Comments: Megan Hardin is a 35 y.o. female who presents to the Emergency Department complaining of gradual onset, constant, left sided sore throat onset 3 days ago. Pt reports associated vomiting, nausea, and fever. Pt reports problems keeping any food or drinks down due to vomiting. Pt has tried a warm salt gargle with no relief to symptoms. She denies any known sick contact. Pt reports problems pt denies abdominal pain or diarrhea. Tolerating small amt of fluids.  Denies swelling of her throat.    Past Medical History  Diagnosis Date  . PCOS (polycystic ovarian syndrome)   . Miscarriage   . Obesity   . Pregnant 04/30/2014  . Shortness of breath dyspnea     on exertion due to weight  . Gestational diabetes mellitus, antepartum    Past Surgical History  Procedure Laterality Date  . Eye surgery Left   . Dilation and curettage of uterus N/A 02/05/2014    Procedure: CERVICAL DILATATION AND SUCTION/SHARP UTERINE EVACUATION;  Surgeon: Lazaro Arms, MD;  Location: [facility];  Service: Gynecology;  Laterality: N/A;  . Cesarean section N/A 12/22/2014    Procedure: CESAREAN SECTION PRIMARY;  Surgeon: Tilda Burrow, MD;  Location: [facility];  Service: Obstetrics;  Laterality: N/A;   Family History  Problem Relation Age of Onset  . Diabetes Mother   . Hypertension Father   . Diabetes Maternal Grandmother   . Diabetes Maternal Grandfather   . Alzheimer's disease Maternal Grandfather   . Other Paternal Grandmother     resp problems   Social History  Substance  Use Topics  . Smoking status: Former Smoker    Types: Cigarettes  . Smokeless tobacco: Never Used  . Alcohol Use: No   OB History    Gravida Para Term Preterm AB TAB SAB Ectopic Multiple Living   0 1     Review of Systems  Constitutional: Negative for fever, chills, activity change and appetite change.  HENT: Positive for sore throat. Negative for congestion, ear pain, facial swelling, trouble swallowing and voice change.   Eyes: Negative for pain and visual disturbance.  Respiratory: Negative for cough and shortness of breath.   Gastrointestinal: Positive for nausea and vomiting. Negative for abdominal pain.  Musculoskeletal: Negative for arthralgias, neck pain and neck stiffness.  Skin: Negative for color change and rash.  Neurological: Negative for dizziness, facial asymmetry, speech difficulty, numbness and headaches.  Hematological: Negative for adenopathy.  All other systems reviewed and are negative.     Allergies  Dilaudid and Doxycycline  Home Medications   Prior to Admission medications   Medication Sig Start Date End Date Taking? Authorizing Provider  Prenatal Multivit-Min-Fe-FA (PRENATAL VITAMINS PO) Take 1 tablet by mouth daily.    Historical Provider, MD   BP 142/76 mmHg  Pulse 104  Temp(Src) 100.4 F (38 C) (Oral)  Resp 16  Ht  (1.727 m)  Wt 310 lb (140.615 kg)  BMI 47.15 kg/m2  SpO2  97%  LMP 05/21/2016 Physical Exam  Constitutional: She is oriented to person, place, and time. She appears well-developed and well-nourished. No distress.  HENT:  Head: Normocephalic.  Erythema and mild edema of the left peritonsillar region Exudate present Uvula midline Airway patent  Eyes: Conjunctivae are normal. Pupils are equal, round, and reactive to light. No scleral icterus.  Neck: Normal range of motion. Neck supple. No thyromegaly present.  Cardiovascular: Normal rate and regular rhythm.   No murmur heard. Pulmonary/Chest: Effort normal  and breath sounds normal. No respiratory distress. She has no wheezes.  Abdominal: Soft. She exhibits no distension. There is no splenomegaly. There is no tenderness. There is no rebound.  Musculoskeletal: Normal range of motion.  Lymphadenopathy:    She has no cervical adenopathy.  Neurological: She is alert and oriented to person, place, and time.  Skin: Skin is warm and dry. No rash noted.  Psychiatric: She has a normal mood and affect. Her behavior is normal.  Nursing note and vitals reviewed.  ED Course  Procedures (including critical care time)  DIAGNOSTIC STUDIES: Oxygen Saturation is 97% on RA, adequate by my interpretation.    COORDINATION OF CARE: 7:30 PM Discussed treatment plan with pt at bedside which includes Rapid Strep Test and pt agreed to plan.  Labs Review Labs Reviewed  RAPID STREP SCREEN (NOT AT [facility])  CULTURE, GROUP A STREP [facility])   I have personally reviewed and evaluated these lab results as part of my medical decision-making.   EKG Interpretation None      MDM   Final diagnoses:  Pharyngitis   Pt non-toxic appearing.  Airway patent.  Uvula midline.  Sx's to left throat.  Strep neg, concerning for possible early PTA.  Will give po prednisone here and Rx for clindamycin and short course of Hycet.  Strict return precautions given.  Stable for d/c  I personally performed the services described in this documentation, which was scribed in my presence. The recorded information has been reviewed and is accurate.   Pauline Ausammy Kenitha Glendinning, PA-C 06/23/16 1209   Loren Raceravid Yelverton, MD 07/09/16 [phone]

## 2016-06-20 NOTE — ED Notes (Signed)
Patient complaining of sore throat and fever x 3 days.  

## 2016-06-20 NOTE — Discharge Instructions (Signed)

## 2016-06-21 ENCOUNTER — Emergency Department (HOSPITAL_COMMUNITY)
Admission: EM | Admit: 2016-06-21 | Discharge: 2016-06-21 | Disposition: A | Discharge status: Home / Self Care | Payer: Medicaid Other | Attending: Emergency Medicine | Admitting: Emergency Medicine

## 2016-06-21 ENCOUNTER — Encounter (HOSPITAL_COMMUNITY): Payer: Self-pay | Admitting: Emergency Medicine

## 2016-06-21 DIAGNOSIS — J029 Acute pharyngitis, unspecified: Secondary | ICD-10-CM

## 2016-06-21 DIAGNOSIS — Z79899 Other long term (current) drug therapy: Secondary | ICD-10-CM | POA: Insufficient documentation

## 2016-06-21 DIAGNOSIS — Z87891 Personal history of nicotine dependence: Secondary | ICD-10-CM | POA: Insufficient documentation

## 2016-06-21 LAB — CBC WITH DIFFERENTIAL/PLATELET
BASOS ABS: 0 10*3/uL (ref 0.0–0.1)
BASOS PCT: 0 %
EOS ABS: 0 10*3/uL (ref 0.0–0.7)
Eosinophils Relative: 0 %
HCT: 41.7 % (ref 36.0–46.0)
HEMOGLOBIN: 14 g/dL (ref 12.0–15.0)
Lymphocytes Relative: 7 %
Lymphs Abs: 1.2 10*3/uL (ref 0.7–4.0)
MCH: 30.6 pg (ref 26.0–34.0)
MCHC: 33.6 g/dL (ref 30.0–36.0)
MCV: 91.2 fL (ref 78.0–100.0)
Monocytes Absolute: 1.5 10*3/uL — ABNORMAL HIGH (ref 0.1–1.0)
Monocytes Relative: 9 %
NEUTROS PCT: 84 %
Neutro Abs: 13.3 10*3/uL — ABNORMAL HIGH (ref 1.7–7.7)
Platelets: 338 10*3/uL (ref 150–400)
RBC: 4.57 MIL/uL (ref 3.87–5.11)
RDW: 13.3 % (ref 11.5–15.5)
WBC: 16 10*3/uL — AB (ref 4.0–10.5)

## 2016-06-21 LAB — COMPREHENSIVE METABOLIC PANEL
ALBUMIN: 4 g/dL (ref 3.5–5.0)
ALK PHOS: 63 U/L (ref 38–126)
ALT: 19 U/L (ref 14–54)
ANION GAP: 5 (ref 5–15)
AST: 16 U/L (ref 15–41)
BUN: 14 mg/dL (ref 6–20)
CALCIUM: 8.9 mg/dL (ref 8.9–10.3)
CHLORIDE: 105 mmol/L (ref 101–111)
CO2: 24 mmol/L (ref 22–32)
CREATININE: 0.82 mg/dL (ref 0.44–1.00)
GFR calc Af Amer: >60 mL/min (ref >60)
GFR calc non Af Amer: >60 mL/min (ref >60)
GLUCOSE: 110 mg/dL — AB (ref 65–99)
Potassium: 3.9 mmol/L (ref 3.5–5.1)
SODIUM: 134 mmol/L — AB (ref 135–145)
Total Bilirubin: 0.7 mg/dL (ref 0.3–1.2)
Total Protein: 7.9 g/dL (ref 6.5–8.1)

## 2016-06-21 MED ORDER — IBUPROFEN 800 MG PO TABS
800.0000 mg | ORAL_TABLET | Freq: Once | ORAL | Status: AC
  Administered 2016-06-21: 800 mg via ORAL
  Filled 2016-06-21: qty 1

## 2016-06-21 NOTE — ED Notes (Signed)
Pt here for re evaluation of sore throat. Also, complain of pain in both sides

## 2016-06-21 NOTE — Discharge Instructions (Signed)
[facility] Doctor List ° ° ° °Edward Hawkins MD. Specialty: Pulmonary Disease Contact information: [address]  °[address]  °[place] [address]  °[phone]  ° °Margaret Simpson, MD. Specialty: Family Medicine Contact information: [address]  °[place] [address]  °[phone]  ° °Scott Luking, MD. Specialty: Family Medicine Contact information: [address]  °[address]  °[place] [address]  °[phone]  ° °Tesfaye Fanta, MD Specialty: Internal Medicine Contact information: [address]  °[place] [address]  °[phone]  ° °Zach Hall, MD. Specialty: Internal Medicine Contact information: [address]  °[place] [address]  °[phone]  ° °Angus Mcinnis, MD. Specialty: Family Medicine Contact information: [address]  °[place] [address]  °[phone]  ° °Stephen Knowlton, MD. Specialty: Family Medicine Contact information: [address]  °[address]  °[place] [address]  °[phone]  ° °Roy Fagan, MD. Specialty: Internal Medicine Contact information: [address]  °[address]  ° [place] [address]  °[phone]  ° ° °

## 2016-06-21 NOTE — ED Notes (Signed)
PT states she was here yesterday evening and was told to return to ED for re-val if no better d/t possible throat abscess. PT denies any treatment for fever today and had one antibiotic pill prior to ED discharge but hasn't filled her prescriptions yet.

## 2016-06-21 NOTE — ED Provider Notes (Signed)
CSN: [id number]     Arrival date & time 06/21/16  1409 History   First MD Initiated Contact with Patient 06/21/16 1452     Chief Complaint  Patient presents with  . Follow-up  . Sore Throat     (Consider location/radiation/quality/duration/timing/severity/associated sxs/prior Treatment) HPI  Sore throat Seen last night Told has possible abscess - neg strep Recheck today Pt has had no meds prior to eval today since last night-  Had to work today and couldn't get meds filled Has Rx in hand Sore throat - persistent, worse with swallowing and has fever today.  Past Medical History  Diagnosis Date  . PCOS (polycystic ovarian syndrome)   . Miscarriage   . Obesity   . Pregnant 04/30/2014  . Shortness of breath dyspnea     on exertion due to weight  . Gestational diabetes mellitus, antepartum    Past Surgical History  Procedure Laterality Date  . Eye surgery Left   . Dilation and curettage of uterus N/A 02/05/2014    Procedure: CERVICAL DILATATION AND SUCTION/SHARP UTERINE EVACUATION;  Surgeon: Lazaro ArmsLuther H Eure, MD;  Location: [facility];  Service: Gynecology;  Laterality: N/A;  . Cesarean section N/A 12/22/2014    Procedure: CESAREAN SECTION PRIMARY;  Surgeon: Tilda BurrowJohn Ferguson V, MD;  Location: [facility];  Service: Obstetrics;  Laterality: N/A;   Family History  Problem Relation Age of Onset  . Diabetes Mother   . Hypertension Father   . Diabetes Maternal Grandmother   . Diabetes Maternal Grandfather   . Alzheimer's disease Maternal Grandfather   . Other Paternal Grandmother     resp problems   Social History  Substance Use Topics  . Smoking status: Former Smoker    Types: Cigarettes  . Smokeless tobacco: Never Used  . Alcohol Use: No   OB History    Gravida Para Term Preterm AB TAB SAB Ectopic Multiple Living   2 1 1  1  1   0 1     Review of Systems  Constitutional: Positive for fever.  HENT: Positive for sore throat.       Allergies  Dilaudid and Doxycycline  Home  Medications   Prior to Admission medications   Medication Sig Start Date End Date Taking? Authorizing Provider  VENTOLIN HFA 108 (90 Base) MCG/ACT inhaler Take 2 puffs by mouth every 4 (four) hours as needed for wheezing or shortness of breath.  03/21/16  Yes Historical Provider, MD  clindamycin (CLEOCIN) 300 MG capsule Take 1 capsule (300 mg total) by mouth 4 (four) times daily. For 7 days Patient not taking: Reported on 06/21/2016 06/20/16   Tammy Triplett, PA-C  HYDROcodone-acetaminophen (HYCET) 7.5-325 mg/15 ml solution Take 10 mLs by mouth every 6 (six) hours as needed for moderate pain. Patient not taking: Reported on 06/21/2016 06/20/16   Tammy Triplett, PA-C   BP 146/62 mmHg  Pulse 127  Temp(Src) 100.3 F (37.9 C) (Oral)  Resp 22  Ht 5\' 8"  (1.727 m)  Wt 307 lb (139.254 kg)  BMI 46.69 kg/m2  SpO2 98%  LMP 05/21/2016 Physical Exam  Constitutional: She appears well-developed and well-nourished.  HENT:  Head: Normocephalic and atraumatic.  Mouth/Throat: Oropharyngeal exudate present.  Normal phonation - no asynmetry or swlling of tonsils / uvula midline, MMM, no distress  Eyes: Conjunctivae are normal. Right eye exhibits no discharge. Left eye exhibits no discharge.  Cardiovascular:  Mild tachy - normal pulses  Pulmonary/Chest: Effort normal. No respiratory distress. She has no wheezes. She has no  rales.  Neurological: She is alert. Coordination normal.  Skin: Skin is warm and dry. No rash noted. She is not diaphoretic. No erythema.  Psychiatric: She has a normal mood and affect.  Nursing note and vitals reviewed.   ED Course  Procedures (including critical care time) Labs Review Labs Reviewed  CBC WITH DIFFERENTIAL/PLATELET - Abnormal; Notable for the following:    WBC 16.0 (*)    Neutro Abs 13.3 (*)    Monocytes Absolute 1.5 (*)    All other components within normal limits  COMPREHENSIVE METABOLIC PANEL    Imaging Review No results found. I have personally reviewed  and evaluated these images and lab results as part of my medical decision-making.    MDM   Final diagnoses:  Sore throat    Well appaering, continuing pharyngitis - has meds as needed including clinda and hydrocodone syrtup - low grade fever - will go home and hydrate / meds and see PCP as neede d- f/u list given, no signs of PTA.  Medications  ibuprofen (ADVIL,MOTRIN) tablet 800 mg (not administered)     Eber Hong, MD 06/21/16 1459

## 2016-06-22 LAB — CULTURE, GROUP A STREP (THRC)

## 2016-06-23 ENCOUNTER — Ambulatory Visit: Payer: Medicaid Other | Admitting: Obstetrics & Gynecology

## 2016-06-23 ENCOUNTER — Emergency Department (HOSPITAL_COMMUNITY): Payer: Self-pay

## 2016-06-23 ENCOUNTER — Encounter (HOSPITAL_COMMUNITY): Payer: Self-pay | Admitting: *Deleted

## 2016-06-23 ENCOUNTER — Emergency Department (HOSPITAL_COMMUNITY)
Admission: EM | Admit: 2016-06-23 | Discharge: 2016-06-23 | Disposition: A | Discharge status: Home / Self Care | Payer: Self-pay | Attending: Emergency Medicine | Admitting: Emergency Medicine

## 2016-06-23 DIAGNOSIS — J039 Acute tonsillitis, unspecified: Secondary | ICD-10-CM

## 2016-06-23 DIAGNOSIS — Z87891 Personal history of nicotine dependence: Secondary | ICD-10-CM | POA: Insufficient documentation

## 2016-06-23 DIAGNOSIS — Z79899 Other long term (current) drug therapy: Secondary | ICD-10-CM | POA: Insufficient documentation

## 2016-06-23 DIAGNOSIS — Z791 Long term (current) use of non-steroidal anti-inflammatories (NSAID): Secondary | ICD-10-CM | POA: Insufficient documentation

## 2016-06-23 LAB — COMPREHENSIVE METABOLIC PANEL WITH GFR
ALT: 29 U/L (ref 14–54)
AST: 29 U/L (ref 15–41)
Albumin: 4.1 g/dL (ref 3.5–5.0)
Alkaline Phosphatase: 76 U/L (ref 38–126)
Anion gap: 11 (ref 5–15)
BUN: 11 mg/dL (ref 6–20)
CO2: 22 mmol/L (ref 22–32)
Calcium: 8.5 mg/dL — ABNORMAL LOW (ref 8.9–10.3)
Chloride: 102 mmol/L (ref 101–111)
Creatinine, Ser: 0.75 mg/dL (ref 0.44–1.00)
GFR calc Af Amer: >60 mL/min
GFR calc non Af Amer: >60 mL/min
Glucose, Bld: 106 mg/dL — ABNORMAL HIGH (ref 65–99)
Potassium: 3.7 mmol/L (ref 3.5–5.1)
Sodium: 135 mmol/L (ref 135–145)
Total Bilirubin: 0.5 mg/dL (ref 0.3–1.2)
Total Protein: 8.2 g/dL — ABNORMAL HIGH (ref 6.5–8.1)

## 2016-06-23 LAB — URINALYSIS, ROUTINE W REFLEX MICROSCOPIC
Glucose, UA: NEGATIVE mg/dL
Ketones, ur: >80 mg/dL — AB
Nitrite: NEGATIVE
Protein, ur: 30 mg/dL — AB
Specific Gravity, Urine: 1.026 (ref 1.005–1.030)
pH: 6 (ref 5.0–8.0)

## 2016-06-23 LAB — CBC WITH DIFFERENTIAL/PLATELET
Basophils Absolute: 0.1 K/uL (ref 0.0–0.1)
Basophils Relative: 1 %
Eosinophils Absolute: 0 K/uL (ref 0.0–0.7)
Eosinophils Relative: 0 %
HCT: 41.3 % (ref 36.0–46.0)
Hemoglobin: 14 g/dL (ref 12.0–15.0)
Lymphocytes Relative: 8 %
Lymphs Abs: 0.9 K/uL (ref 0.7–4.0)
MCH: 30.6 pg (ref 26.0–34.0)
MCHC: 33.9 g/dL (ref 30.0–36.0)
MCV: 90.2 fL (ref 78.0–100.0)
Monocytes Absolute: 0.8 K/uL (ref 0.1–1.0)
Monocytes Relative: 7 %
Neutro Abs: 9.1 K/uL — ABNORMAL HIGH (ref 1.7–7.7)
Neutrophils Relative %: 84 %
Platelets: 321 K/uL (ref 150–400)
RBC: 4.58 MIL/uL (ref 3.87–5.11)
RDW: 13.6 % (ref 11.5–15.5)
WBC: 10.8 K/uL — ABNORMAL HIGH (ref 4.0–10.5)

## 2016-06-23 LAB — PREGNANCY, URINE: Preg Test, Ur: NEGATIVE

## 2016-06-23 LAB — URINE MICROSCOPIC-ADD ON

## 2016-06-23 LAB — LIPASE, BLOOD: Lipase: 17 U/L (ref 11–51)

## 2016-06-23 LAB — RAPID STREP SCREEN (MED CTR MEBANE ONLY): Streptococcus, Group A Screen (Direct): NEGATIVE

## 2016-06-23 MED ORDER — IPRATROPIUM-ALBUTEROL 0.5-2.5 (3) MG/3ML IN SOLN
3.0000 mL | Freq: Four times a day (QID) | RESPIRATORY_TRACT | Status: DC
  Administered 2016-06-23: 3 mL via RESPIRATORY_TRACT
  Filled 2016-06-23: qty 3

## 2016-06-23 MED ORDER — SODIUM CHLORIDE 0.9 % IV BOLUS (SEPSIS)
1000.0000 mL | Freq: Once | INTRAVENOUS | Status: AC
  Administered 2016-06-23: 1000 mL via INTRAVENOUS

## 2016-06-23 MED ORDER — AMOXICILLIN 875 MG PO TABS: 875.0000 mg | ORAL_TABLET | Freq: Two times a day (BID) | ORAL | Status: DC

## 2016-06-23 MED ORDER — IOPAMIDOL (ISOVUE-300) INJECTION 61%
75.0000 mL | Freq: Once | INTRAVENOUS | Status: AC | PRN
  Administered 2016-06-23: 75 mL via INTRAVENOUS

## 2016-06-23 MED ORDER — ACETAMINOPHEN 325 MG PO TABS
650.0000 mg | ORAL_TABLET | Freq: Once | ORAL | Status: AC
  Administered 2016-06-23: 650 mg via ORAL
  Filled 2016-06-23: qty 2

## 2016-06-23 MED ORDER — DEXAMETHASONE SODIUM PHOSPHATE 10 MG/ML IJ SOLN
10.0000 mg | Freq: Once | INTRAMUSCULAR | Status: AC
  Administered 2016-06-23: 10 mg via INTRAVENOUS
  Filled 2016-06-23: qty 1

## 2016-06-23 MED ORDER — MORPHINE SULFATE (PF) 4 MG/ML IV SOLN
4.0000 mg | Freq: Once | INTRAVENOUS | Status: AC
  Administered 2016-06-23: 4 mg via INTRAVENOUS
  Filled 2016-06-23: qty 1

## 2016-06-23 NOTE — ED Notes (Signed)
Pt c/o sore throat, n/v, abdominal pain, weakness and fever. Was seen at [facility] and given clindamycin and vicodin, with no relief. Pt sts left side of throat is swollen and is hard to swallow.

## 2016-06-23 NOTE — Discharge Instructions (Signed)
Tonsillitis Tonsillitis is an infection of the throat that causes the tonsils to become red, tender, and swollen. Tonsils are collections of lymphoid tissue at the back of the throat. Each tonsil has crevices (crypts). Tonsils help fight nose and throat infections and keep infection from spreading to other parts of the body for the first 18 months of life.  CAUSES Sudden (acute) tonsillitis is usually caused by infection with streptococcal bacteria. Long-lasting (chronic) tonsillitis occurs when the crypts of the tonsils become filled with pieces of food and bacteria, which makes it easy for the tonsils to become repeatedly infected. SYMPTOMS  Symptoms of tonsillitis include:  A sore throat, with possible difficulty swallowing.  White patches on the tonsils.  Fever.  Tiredness.  New episodes of snoring during sleep, when you did not snore before.  Small, foul-smelling, yellowish-white pieces of material (tonsilloliths) that you occasionally cough up or spit out. The tonsilloliths can also cause you to have bad breath. DIAGNOSIS Tonsillitis can be diagnosed through a physical exam. Diagnosis can be confirmed with the results of lab tests, including a throat culture. TREATMENT  The goals of tonsillitis treatment include the reduction of the severity and duration of symptoms and prevention of associated conditions. Symptoms of tonsillitis can be improved with the use of steroids to reduce the swelling. Tonsillitis caused by bacteria can be treated with antibiotic medicines. Usually, treatment with antibiotic medicines is started before the cause of the tonsillitis is known. However, if it is determined that the cause is not bacterial, antibiotic medicines will not treat the tonsillitis. If attacks of tonsillitis are severe and frequent, your health care provider may recommend surgery to remove the tonsils (tonsillectomy). HOME CARE INSTRUCTIONS   Rest as much as possible and get plenty of  sleep.  Drink plenty of fluids. While the throat is very sore, eat soft foods or liquids, such as sherbet, soups, or instant breakfast drinks.  Eat frozen ice pops.  Gargle with a warm or cold liquid to help soothe the throat. Mix 1/4 teaspoon of salt and 1/4 teaspoon of baking soda in 8 oz of water. SEEK MEDICAL CARE IF:   Large, tender lumps develop in your neck.  A rash develops.  A green, yellow-brown, or bloody substance is coughed up.  You are unable to swallow liquids or food for 24 hours.  You notice that only one of the tonsils is swollen. SEEK IMMEDIATE MEDICAL CARE IF:   You develop any new symptoms such as vomiting, severe headache, stiff neck, chest pain, or trouble breathing or swallowing.  You have severe throat pain along with drooling or voice changes.  You have severe pain, unrelieved with recommended medications.  You are unable to fully open the mouth.  You develop redness, swelling, or severe pain anywhere in the neck.  You have a fever. MAKE SURE YOU:   Understand these instructions.  Will watch your condition.  Will get help right away if you are not doing well or get worse.   This information is not intended to replace advice given to you by your health care provider. Make sure you discuss any questions you have with your health care provider.  Discontinue home clindamycin, take amoxicillin instead. Please follow up with urgent care for a re-check in 2 days. Return to the ED sooner if you experience severe worsening of your symptoms, difficulty breathing or swallowing, rash, chest pain, confusion, increased fevers. Take tylenol at home for fever. Continue home hydrocodone syrup for pain.

## 2016-06-23 NOTE — ED Provider Notes (Signed)
CSN: [id number]     Arrival date & time 06/23/16  1053 History   First MD Initiated Contact with Patient 06/23/16 1137     Chief Complaint  Patient presents with  . Sore Throat     (Consider location/radiation/quality/duration/timing/severity/associated sxs/prior Treatment) HPI   Megan Hardin is a  35 y.o F  With a pmhx of PCOS Who presents to the emergency department today complaining of sore throat and fever. Patient states that 6 days ago she developed left-sided sore throat, fever and diffuse myalgias. Patient tried warm salt rinses without relief. She presented to [facility] ED and was told that she had severe tonsillitis despite a negative strep test. There was concern for peritonsillar abscess. She was placed on clindamycin and told to return the following day for a recheck of her symptoms. Patient returned to [facility] the following day and was discharged home with instructions for continued conservative therapy and clindamycin. Patient states that her symptoms have only worsened another clindamycin has not helped. She now has soreness on both sides of her throat and a very swollen neck. Patient states that she is a continued fevers and is unable to tolerate anything by mouth. She reports associated vomiting and a 10lb weight loss over the last 6 days. Pt states that when she is able to get any food or drink down she has immediate sharp pains on the sides of her abdomen and then vomits. No sick contacts. No recent travel. No rash.   Past Medical History  Diagnosis Date  . PCOS (polycystic ovarian syndrome)   . Miscarriage   . Obesity   . Pregnant 04/30/2014  . Shortness of breath dyspnea     on exertion due to weight  . Gestational diabetes mellitus, antepartum    Past Surgical History  Procedure Laterality Date  . Eye surgery Left   . Dilation and curettage of uterus N/A 02/05/2014    Procedure: CERVICAL DILATATION AND SUCTION/SHARP UTERINE EVACUATION;  Surgeon: Lazaro ArmsLuther H Eure,  MD;  Location: [facility];  Service: Gynecology;  Laterality: N/A;  . Cesarean section N/A 12/22/2014    Procedure: CESAREAN SECTION PRIMARY;  Surgeon: Tilda BurrowJohn Ferguson V, MD;  Location: [facility];  Service: Obstetrics;  Laterality: N/A;   Family History  Problem Relation Age of Onset  . Diabetes Mother   . Hypertension Father   . Diabetes Maternal Grandmother   . Diabetes Maternal Grandfather   . Alzheimer's disease Maternal Grandfather   . Other Paternal Grandmother     resp problems   Social History  Substance Use Topics  . Smoking status: Former Smoker    Types: Cigarettes  . Smokeless tobacco: Never Used  . Alcohol Use: No   OB History    Gravida Para Term Preterm AB TAB SAB Ectopic Multiple Living   2 1 1  1  1   0 1     Review of Systems  All other systems reviewed and are negative.     Allergies  Dilaudid and Doxycycline  Home Medications   Prior to Admission medications   Medication Sig Start Date End Date Taking? Authorizing Provider  clindamycin (CLEOCIN) 300 MG capsule Take 1 capsule (300 mg total) by mouth 4 (four) times daily. For 7 days Patient taking differently: Take 300 mg by mouth 4 (four) times daily. Started 07/18 For 7 days 06/20/16  Yes Tammy Triplett, PA-C  HYDROcodone-acetaminophen (HYCET) 7.5-325 mg/15 ml solution Take 10 mLs by mouth every 6 (six) hours as needed  for moderate pain. 06/20/16  Yes Tammy Triplett, PA-C  ibuprofen (ADVIL,MOTRIN) 200 MG tablet Take 600 mg by mouth every 6 (six) hours as needed for fever, headache, mild pain, moderate pain or cramping.   Yes Historical Provider, MD  Probiotic Product (PROBIOTIC DAILY) CAPS Take 1 capsule by mouth daily.   Yes Historical Provider, MD  VENTOLIN HFA 108 (90 Base) MCG/ACT inhaler Take 2 puffs by mouth every 4 (four) hours as needed for wheezing or shortness of breath.  03/21/16  Yes Historical Provider, MD   BP 134/71 mmHg  Pulse 111  Temp(Src) 100.3 F (37.9 C) (Oral)  Resp 18  SpO2 94%  LMP  06/19/2016 Physical Exam  Constitutional: She is oriented to person, place, and time. She appears well-developed and well-nourished. No distress.  Febrile   HENT:  Head: Normocephalic and atraumatic.  Mouth/Throat: No oropharyngeal exudate.  Eyes: Conjunctivae and EOM are normal. Pupils are equal, round, and reactive to light. Right eye exhibits no discharge. Left eye exhibits no discharge. No scleral icterus.  Cardiovascular: Regular rhythm, normal heart sounds and intact distal pulses.  Exam reveals no gallop and no friction rub.   No murmur heard. Tachycardic, HR 111 bpm  Pulmonary/Chest: Effort normal. No respiratory distress. She has wheezes ( diffuse expiratory). She has no rales. She exhibits no tenderness.  Abdominal: Soft. She exhibits no distension. There is no tenderness. There is no guarding.  Musculoskeletal: Normal range of motion. She exhibits no edema.  Lymphadenopathy:    She has cervical adenopathy.  Neurological: She is alert and oriented to person, place, and time.  Skin: Skin is warm and dry. No rash noted. She is not diaphoretic. No erythema. No pallor.  Psychiatric: She has a normal mood and affect. Her behavior is normal.  Nursing note and vitals reviewed.   ED Course  Procedures (including critical care time) Labs Review Labs Reviewed  RAPID STREP SCREEN (NOT AT [facility])  COMPREHENSIVE METABOLIC PANEL  LIPASE, BLOOD  CBC WITH DIFFERENTIAL/PLATELET    Imaging Review Dg Chest 2 View  06/23/2016  CLINICAL DATA:  Cough. Chest congestion. Chest tightness. Fever and vomiting. Duration of symptoms 5 days. EXAM: CHEST  2 VIEW COMPARISON:  10/20/2006 FINDINGS: Heart size is normal. Mediastinal shadows are normal. The lungs are clear. No bronchial thickening. No infiltrate, mass, effusion or collapse. Pulmonary vascularity is normal. No bony abnormality. IMPRESSION: Normal chest Electronically Signed   By: Paulina Fusi M.D.   On: 06/23/2016 12:43   Ct Soft Tissue Neck  W Contrast  06/23/2016  CLINICAL DATA:  Sore throat, fever, and bilateral neck swelling beginning 5 days ago. EXAM: CT NECK WITH CONTRAST TECHNIQUE: Multidetector CT imaging of the neck was performed using the standard protocol following the bolus administration of intravenous contrast. CONTRAST:  75mL ISOVUE-300 IOPAMIDOL (ISOVUE-300) INJECTION 61% COMPARISON:  None. FINDINGS: Pharynx and larynx: There is moderate symmetric prominence of the posterior nasopharyngeal soft tissues without discrete mass. There is moderate asymmetric enlargement of the left palatine tonsil compared to the right without evidence of tonsillar/peritonsillar fluid collection. Palatine tonsillar enhancement is slightly heterogeneous bilaterally. Bilateral tonsillar calcifications are noted. The lingual tonsils are symmetrically enlarged. The epiglottis is not thickened. The larynx is unremarkable. No retropharyngeal fluid collection. Salivary glands: Submandibular and parotid glands are unremarkable. Thyroid: Unremarkable. Lymph nodes: There are multiple enlarged level II lymph nodes bilaterally measuring up to 1.4 cm in short axis on the right and 2.0 cm on the left. Mildly enlarged level III lymph nodes  are present bilaterally. Vascular: Major vascular structures of the neck are grossly patent. Limited intracranial: Unremarkable. Visualized orbits: Unremarkable. Mastoids and visualized paranasal sinuses: Clear. Skeleton: Anterior endplate osteophytosis at C5-6. Upper chest: Clear lung apices. IMPRESSION: 1. Diffuse enlargement of the palatine, adenoid, and lingual tonsils with asymmetric left palatine tonsil enlargement likely reflecting infectious pharyngitis. No abscess. 2. Reactive bilateral upper cervical lymphadenopathy. Electronically Signed   By: Sebastian Ache M.D.   On: 06/23/2016 15:10   I have personally reviewed and evaluated these images and lab results as part of my medical decision-making.   EKG Interpretation None       MDM   Final diagnoses:  Tonsillitis   35 year old female presents to the ED complaining of sore throat and fever ongoing for 6 days. Patient was seen in the ED twice before and was given clindamycin when she has been taking without relief. Patient has now developed vomiting and intermittent abdominal pain. Physical exam patient appears uncomfortable but is non-toxic or septic appearing. Patient does have a temperature of 100.3 and has an elevated heart rate to 111. Feel that tachycardia is secondary to fever, do not feel that this is sepsis. She was given IV fluids and acetaminophen as well as pain medication and Decadron. Throat is erythematous but no tonsillar exudate seen. No PTA visualized. Rapid strep is negative. NO leukocytosis. However, neck appears swollen and pt is having significant dysphagia. Will obtain CT neck to r/o deep space infection. CXR also ordered given wheezing on lung exam and fever. NO PNA seen on cxr. Pt given duo neb with significant symptomatic improvement. Repeat lung exam is clear. CT reveals tonsillitis and reactive cervical adenopathy, no abscess. Upon repeat exam, pt appears much more comfortable. HR improved, 90 bpm. Pt no longer febrile. She is tolerating PO in the ED. Will d/c home with amoxicillin, d/c clindamycin. Pt has home pain medication. STRICT return precautions given. Follow up with urgent care in 1-2 days for re-evaluation.  Patient was discussed with and seen by Dr. Criss Alvine who agrees with the treatment plan.       Lester Kinsman [place], PA-C 06/23/16 1930  Pricilla Loveless, MD 06/24/16 [phone]

## 2016-06-23 NOTE — Progress Notes (Signed)
Pt confirms she is from [place] and without a pcp  Cm provided her with a 3 page list of free clinics near [place] for follow up care to include [facility] [place], [facility] etc Pt appreciative of resources offered

## 2016-06-25 LAB — CULTURE, GROUP A STREP (THRC)

## 2016-06-29 ENCOUNTER — Ambulatory Visit: Payer: Medicaid Other | Admitting: Obstetrics & Gynecology

## 2016-07-14 ENCOUNTER — Ambulatory Visit (INDEPENDENT_AMBULATORY_CARE_PROVIDER_SITE_OTHER): Payer: Self-pay | Admitting: Obstetrics & Gynecology

## 2016-07-14 ENCOUNTER — Encounter: Payer: Self-pay | Admitting: Obstetrics & Gynecology

## 2016-07-14 VITALS — BP 110/60 | HR 72 | Ht 62.0 in | Wt 299.4 lb

## 2016-07-14 DIAGNOSIS — R739 Hyperglycemia, unspecified: Secondary | ICD-10-CM

## 2016-07-14 DIAGNOSIS — E282 Polycystic ovarian syndrome: Secondary | ICD-10-CM

## 2016-07-14 NOTE — Progress Notes (Signed)
      Chief Complaint  Patient presents with  . want get pregnant    want to be put on clomid.    Blood pressure 110/60, pulse 72, height 5\' 2"  (1.575 m), weight 299 lb 6.4 oz (135.8 kg), last menstrual period 06/22/2016, currently breastfeeding.  35 y.o. G2P1011 Patient's last menstrual period was 06/22/2016. The current method of family planning is none.  Subjective The patient and in to discuss getting pregnant She had a Clomid induced pregnancy which she ended up having a spontaneous miscarriage Subsequently she spontaneously was pregnant and had a relatively uneventful pregnancy requiring a C-section for breech in January 2016 She has not been using any birth control since November December of last year so the past 8 months nothing she is having regular periods and her worst like clockwork and she is using ovulation predictor kits and timing her intercourse based on that She is currently not on metformin  Objective   Pertinent ROS  No burning with urination, frequency or urgency No nausea, vomiting or diarrhea Nor fever chills or other constitutional symptoms   Labs or studies pending    Impression Diagnoses this Encounter::   ICD-9-CM ICD-10-CM   1. PCOS (polycystic ovarian syndrome) 256.4 E28.2 HgB A1c  2. Hyperglycemia 790.29 R73.9 HgB A1c    Established relevant diagnosis(es):   Plan/Recommendations: No orders of the defined types were placed in this encounter.   Labs or Scans Ordered: Orders Placed This Encounter  Procedures  . HgB A1c    Management:: We'll check a hemoglobin A1c first and see if it might be beneficial going metformin Adding regular cycles and regular ovulation not sure Clomid is, add anything to our successful pregnancy rate We'll follow-up F the hemoglobin A1c and decide if anything further needs to be done or if we'll just let things play out spontaneously through the end of the year and then go from there  Follow up Return  if symptoms worsen or fail to improve.        Face to face time:  15 minutes  Greater than 50% of the visit time was spent in counseling and coordination of care with the patient.  The summary and outline of the counseling and care coordination is summarized in the note above.   All questions were answered.

## 2016-07-15 LAB — HEMOGLOBIN A1C
ESTIMATED AVERAGE GLUCOSE: 126 mg/dL
HEMOGLOBIN A1C: 6 % — AB (ref 4.8–5.6)

## 2016-07-20 ENCOUNTER — Telehealth: Payer: Self-pay | Admitting: Obstetrics & Gynecology

## 2016-07-20 ENCOUNTER — Other Ambulatory Visit: Payer: Self-pay | Admitting: Obstetrics & Gynecology

## 2016-07-20 MED ORDER — METFORMIN HCL 500 MG PO TABS: 500.0000 mg | ORAL_TABLET | Freq: Two times a day (BID) | ORAL | 6 refills | Status: AC

## 2016-07-20 NOTE — Telephone Encounter (Signed)
Pt informed A1C results 6.0 from 07/14/2016. Please advise of any recommendations.

## 2016-07-20 NOTE — Telephone Encounter (Signed)
Pt called stating that she had some labs done and she was suppose to get the results on my chart but she has not received them. Please contact pt, because she would like to know what is the next step

## 2016-07-21 ENCOUNTER — Telehealth: Payer: Self-pay | Admitting: *Deleted

## 2016-07-21 NOTE — Telephone Encounter (Signed)
Pt informed per Dr. Despina HiddenEure, A1C a little elevated, Metformin e-scribed. Pt verbalized understanding.

## 2016-09-24 IMAGING — CR DG CHEST 2V
2 series · 2 of 2 positions shown · non-contrast
Comparison: 10/20/2006

CLINICAL DATA: Cough. Chest congestion. Chest tightness. Fever and
vomiting. Duration of symptoms 5 days.

EXAM:
CHEST  2 VIEW

[w chest pa]
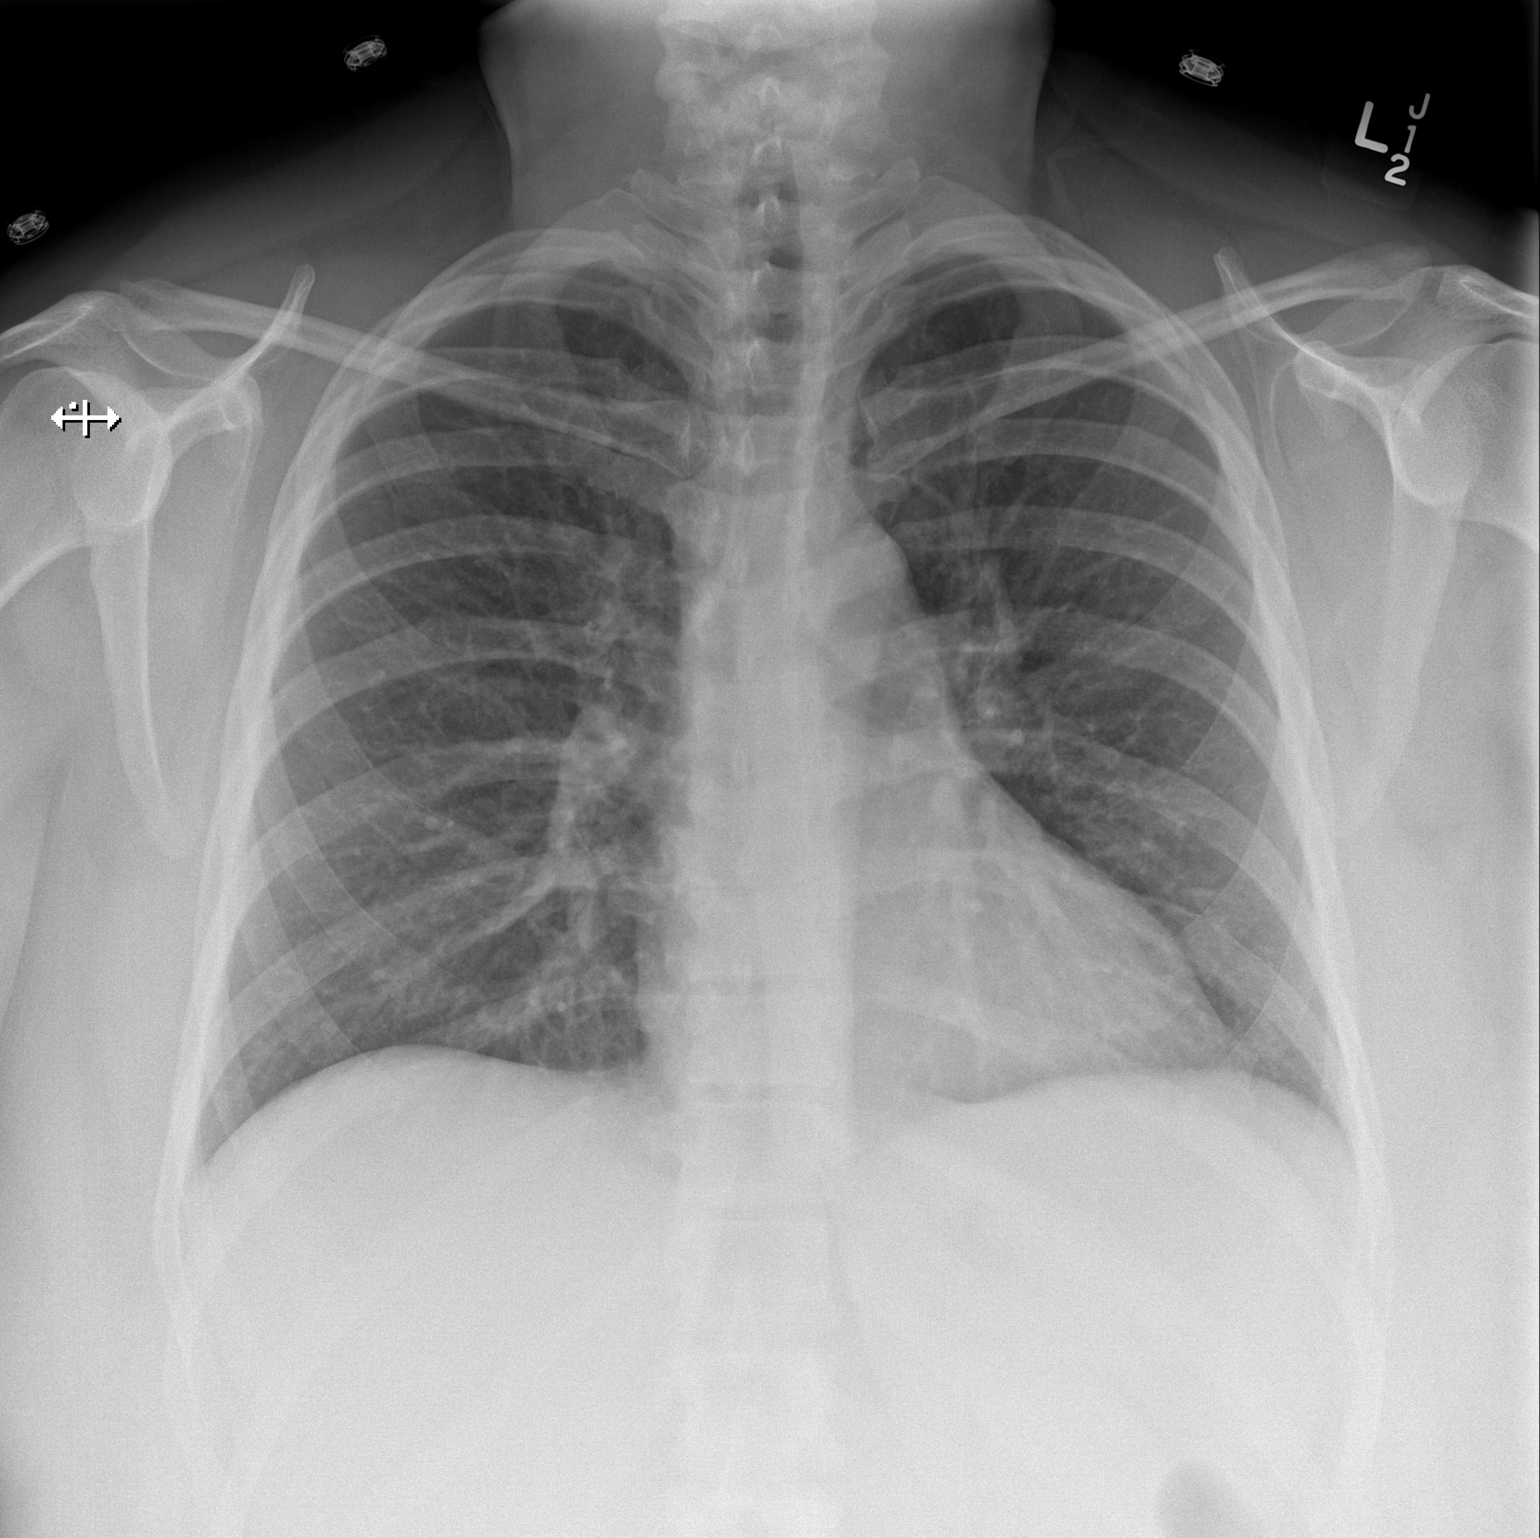

[w chest lat]
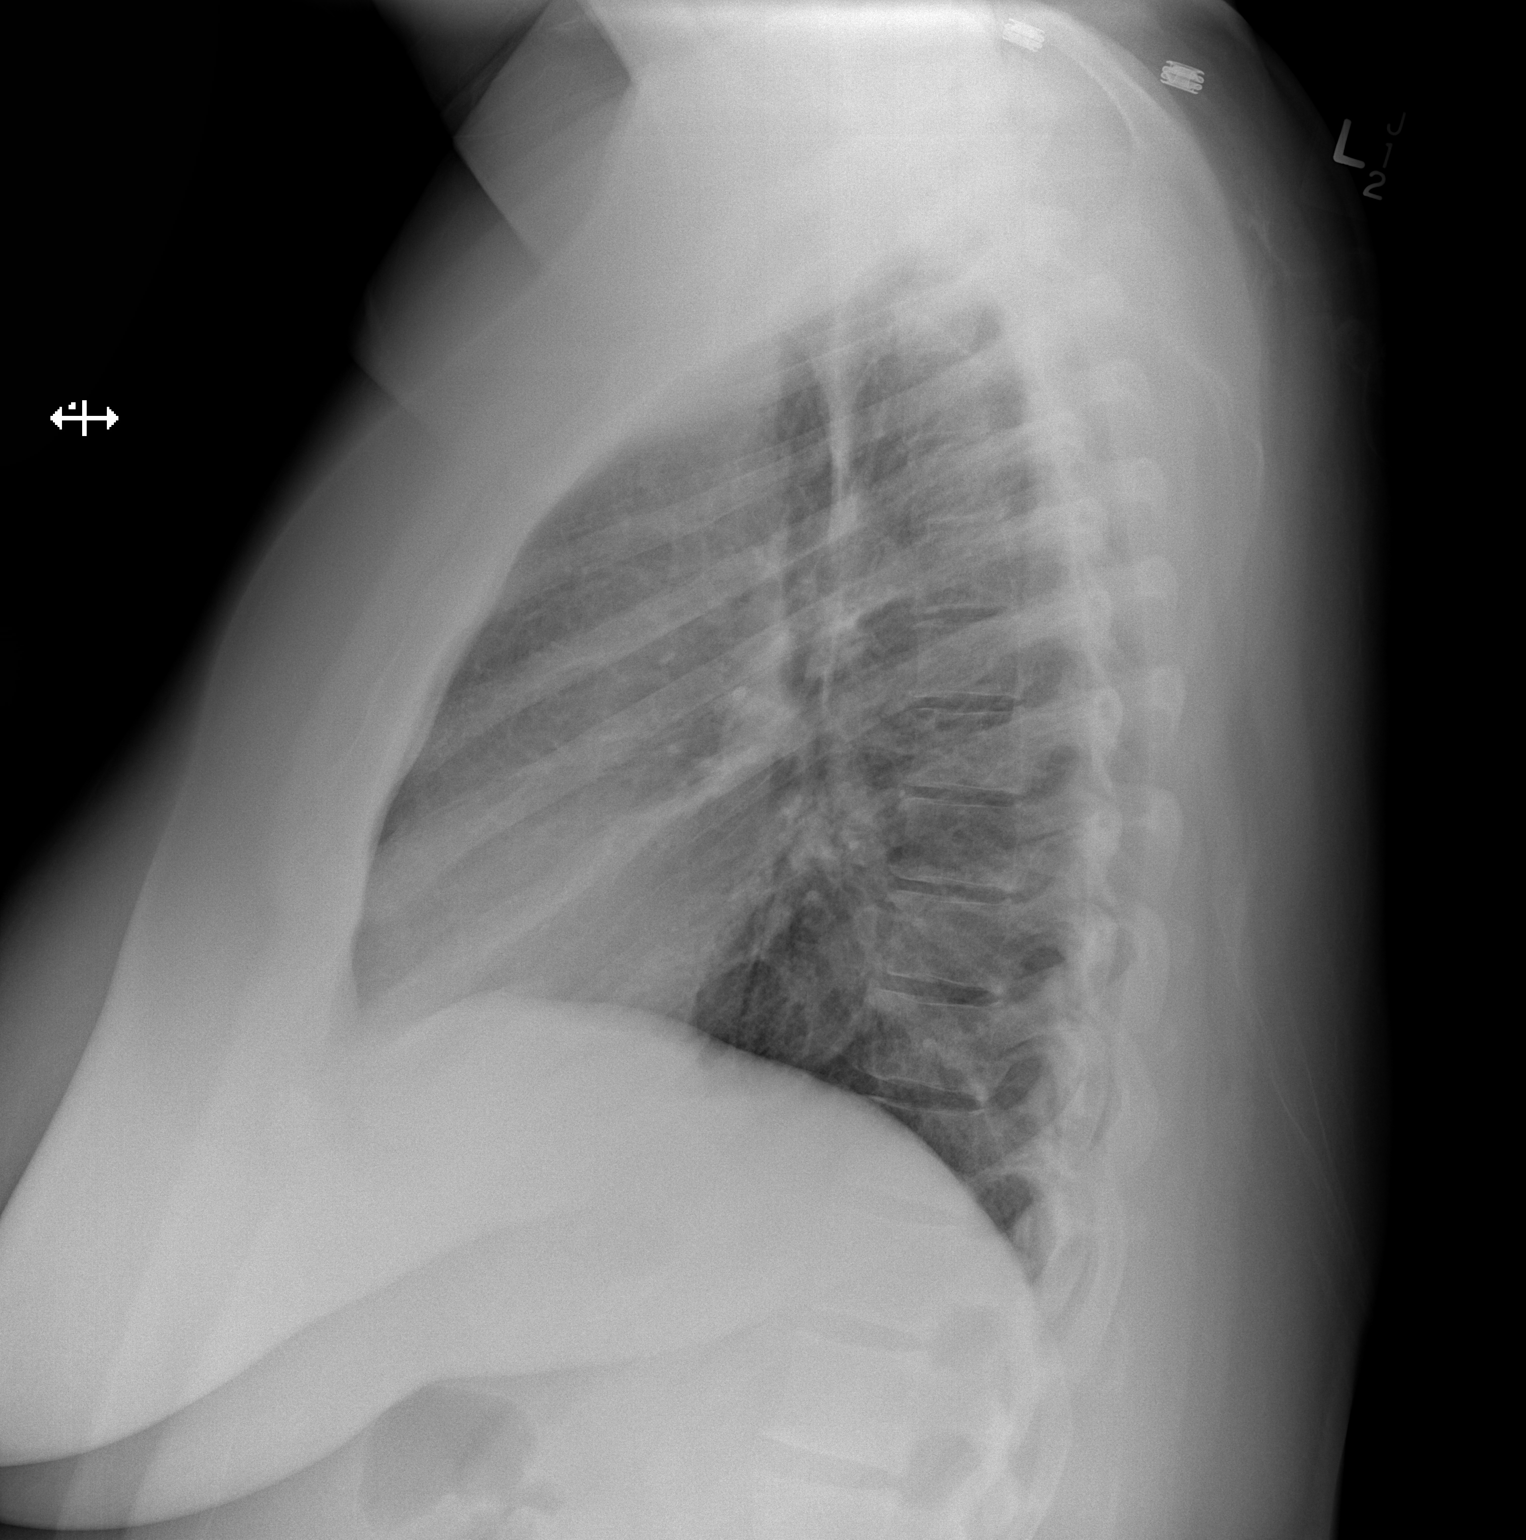

[2 of 2 positions shown; findings below may reference images not displayed]

FINDINGS: Heart size is normal. Mediastinal shadows are normal. The lungs are
clear. No bronchial thickening. No infiltrate, mass, effusion or
collapse. Pulmonary vascularity is normal. No bony abnormality.
IMPRESSION: Normal chest

## 2016-10-17 ENCOUNTER — Ambulatory Visit: Payer: Self-pay | Admitting: Adult Health
